# Patient Record
Sex: Male | Born: 1965 | Race: White | Hispanic: No | Marital: Married | Smoking: Never smoker
Health system: Southern US, Community
[De-identification: ages and names within clinical notes are randomized; demographics above are authoritative.]

## PROBLEM LIST (undated history)

## (undated) DIAGNOSIS — I10 Essential (primary) hypertension: Secondary | ICD-10-CM

## (undated) DIAGNOSIS — E559 Vitamin D deficiency, unspecified: Secondary | ICD-10-CM

## (undated) DIAGNOSIS — Z8489 Family history of other specified conditions: Secondary | ICD-10-CM

## (undated) HISTORY — PX: KNEE SURGERY: SHX244

---

## 2006-01-19 ENCOUNTER — Ambulatory Visit: Payer: Self-pay | Admitting: Unknown Physician Specialty

## 2006-01-30 ENCOUNTER — Ambulatory Visit: Payer: Self-pay | Admitting: Unknown Physician Specialty

## 2017-05-17 ENCOUNTER — Emergency Department: Payer: BC Managed Care – PPO

## 2017-05-17 ENCOUNTER — Encounter: Payer: Self-pay | Admitting: Emergency Medicine

## 2017-05-17 ENCOUNTER — Emergency Department
Admission: EM | Admit: 2017-05-17 | Discharge: 2017-05-17 | Disposition: A | Discharge status: Home / Self Care | Payer: BC Managed Care – PPO | Attending: Emergency Medicine | Admitting: Emergency Medicine

## 2017-05-17 DIAGNOSIS — R0789 Other chest pain: Secondary | ICD-10-CM | POA: Insufficient documentation

## 2017-05-17 DIAGNOSIS — R079 Chest pain, unspecified: Secondary | ICD-10-CM

## 2017-05-17 LAB — CBC
HCT: 45.9 % (ref 40.0–52.0)
Hemoglobin: 15.7 g/dL (ref 13.0–18.0)
MCH: 30.6 pg (ref 26.0–34.0)
MCHC: 34.3 g/dL (ref 32.0–36.0)
MCV: 89.5 fL (ref 80.0–100.0)
PLATELETS: 257 10*3/uL (ref 150–440)
RBC: 5.13 MIL/uL (ref 4.40–5.90)
RDW: 12.9 % (ref 11.5–14.5)
WBC: 7.9 10*3/uL (ref 3.8–10.6)

## 2017-05-17 LAB — FIBRIN DERIVATIVES D-DIMER (ARMC ONLY): Fibrin derivatives D-dimer (ARMC): 352.51 ng{FEU}/mL (ref 0.00–499.00)

## 2017-05-17 LAB — TROPONIN I: Troponin I: <0.03 ng/mL

## 2017-05-17 LAB — BASIC METABOLIC PANEL WITH GFR
Anion gap: 10 (ref 5–15)
BUN: 10 mg/dL (ref 6–20)
CO2: 24 mmol/L (ref 22–32)
Calcium: 9 mg/dL (ref 8.9–10.3)
Chloride: 102 mmol/L (ref 101–111)
Creatinine, Ser: 0.96 mg/dL (ref 0.61–1.24)
GFR calc Af Amer: >60 mL/min
GFR calc non Af Amer: >60 mL/min
Glucose, Bld: 99 mg/dL (ref 65–99)
Potassium: 3.7 mmol/L (ref 3.5–5.1)
Sodium: 136 mmol/L (ref 135–145)

## 2017-05-17 MED ORDER — ASPIRIN 81 MG PO CHEW
324.0000 mg | CHEWABLE_TABLET | Freq: Once | ORAL | Status: AC
  Administered 2017-05-17: 324 mg via ORAL
  Filled 2017-05-17: qty 4

## 2017-05-17 MED ORDER — ASPIRIN 81 MG PO TABS: 81.0000 mg | ORAL_TABLET | Freq: Every day | ORAL | 0 refills | Status: DC

## 2017-05-17 MED ORDER — CYCLOBENZAPRINE HCL 5 MG PO TABS: 5.0000 mg | ORAL_TABLET | Freq: Three times a day (TID) | ORAL | 0 refills | Status: DC | PRN

## 2017-05-17 MED ORDER — DICLOFENAC SODIUM 1 % TD GEL: 2.0000 g | Freq: Four times a day (QID) | TRANSDERMAL | 0 refills | Status: DC

## 2017-05-17 NOTE — Discharge Instructions (Signed)

## 2017-05-17 NOTE — ED Triage Notes (Signed)
FIRST NURSE NOTE-from [facility] for chest pain. bp elevated.  No distress. No medical hx.

## 2017-05-17 NOTE — ED Notes (Signed)
ED Provider at bedside. 

## 2017-05-17 NOTE — ED Triage Notes (Signed)
Pt states that he has been having chest pain for the past week. Pt denies radiation of pain or any associated symptoms. Pt in NAD at this time.

## 2017-05-17 NOTE — ED Provider Notes (Signed)
[facility] Emergency Department Provider Note  ____________________________________________  Time seen: Approximately 6:09 PM  I have reviewed the triage vital signs and the nursing notes.   HISTORY  Chief Complaint Chest Pain   HPI Paul Benton is a 51 y.o. male with no significant past medical history who presents for evaluation of chest pain. Patient reports 1 week of left-sided chest pain around the nipple area that he describes as burning/dull pain that is constantand nonradiating. The pain is slightly worse with inspiration. Patient reports that the pain is worse when he is sitting up but almost fully resolves when he walks around or lays flat. No diaphoresis, nausea, vomiting, palpitations, shortness of breath. Patient reports that he was working in his yard a few days ago and the pain did not get any worse. Denies any personal or family history of blood clots, recent travel or immobilization, leg pain or swelling, hemoptysis, or exogenous hormones. Has a history of heart attack in his grandfather. Pain is 5/10. No cough, congestion, fever, chills, abdominal pain. Patient denies trauma.  History reviewed. No pertinent past medical history.  There are no active problems to display for this patient.   Past Surgical History:  Procedure Laterality Date  . KNEE SURGERY      Prior to Admission medications   Medication Sig Start Date End Date Taking? Authorizing Provider  aspirin 81 MG tablet Take 1 tablet (81 mg total) by mouth daily. 05/17/17   Nita Sickle, MD  cyclobenzaprine (FLEXERIL) 5 MG tablet Take 1 tablet (5 mg total) by mouth 3 (three) times daily as needed for muscle spasms. 05/17/17   Nita Sickle, MD  diclofenac sodium (VOLTAREN) 1 % GEL Apply 2 g topically 4 (four) times daily. 05/17/17   Nita Sickle, MD    Allergies Patient has no known allergies.  No family history on file.  Social History Social History    Substance Use Topics  . Smoking status: Never Smoker  . Smokeless tobacco: Never Used  . Alcohol use Yes     Comment: social    Review of Systems  Constitutional: Negative for fever. Eyes: Negative for visual changes. ENT: Negative for sore throat. Neck: No neck pain  Cardiovascular: + L sided chest pain. Respiratory: Negative for shortness of breath. Gastrointestinal: Negative for abdominal pain, vomiting or diarrhea. Genitourinary: Negative for dysuria. Musculoskeletal: Negative for back pain. Skin: Negative for rash. Neurological: Negative for headaches, weakness or numbness. Psych: No SI or HI  ____________________________________________   PHYSICAL EXAM:  VITAL SIGNS: ED Triage Vitals  Enc Vitals Group     BP 05/17/17 1353 (!) 159/91     Pulse Rate 05/17/17 1353 69     Resp 05/17/17 1353 16     Temp 05/17/17 1351 98.1 F (36.7 C)     Temp Source 05/17/17 1351 Oral     SpO2 05/17/17 1353 99 %     Weight 05/17/17 1350 225 lb (102.1 kg)     Height 05/17/17 1350 5\' 11"  (1.803 m)     Head Circumference --      Peak Flow --      Pain Score 05/17/17 1350 3     Pain Loc --      Pain Edu? --      Excl. in GC? --     Constitutional: Alert and oriented. Well appearing and in no apparent distress. HEENT:      Head: Normocephalic and atraumatic.  Eyes: Conjunctivae are normal. Sclera is non-icteric.       Mouth/Throat: Mucous membranes are moist.       Neck: Supple with no signs of meningismus. Cardiovascular: Regular rate and rhythm. No murmurs, gallops, or rubs. 2+ symmetrical distal pulses are present in all extremities. No JVD. Palpation of the chest with no masses, pain not reproducible on exam Respiratory: Normal respiratory effort. Lungs are clear to auscultation bilaterally. No wheezes, crackles, or rhonchi.  Gastrointestinal: Soft, non tender, and non distended with positive bowel sounds. No rebound or guarding. Musculoskeletal: Nontender with normal  range of motion in all extremities. No edema, cyanosis, or erythema of extremities. Neurologic: Normal speech and language. Face is symmetric. Moving all extremities. No gross focal neurologic deficits are appreciated. Skin: Skin is warm, dry and intact. No rash noted. Psychiatric: Mood and affect are normal. Speech and behavior are normal.  ____________________________________________   LABS (all labs ordered are listed, but only abnormal results are displayed)  Labs Reviewed  BASIC METABOLIC PANEL  CBC  TROPONIN I  TROPONIN I  FIBRIN DERIVATIVES D-DIMER ([facility] ONLY)   ____________________________________________  EKG  ED ECG REPORT I, Nita Sicklearolina Janann Boeve, the attending physician, personally viewed and interpreted this ECG.  Normal sinus rhythm, rate of 73, normal intervals, normal axis, no ST elevations or depressions. No prior for comparison. ____________________________________________  RADIOLOGY  CXR: Negative  ____________________________________________   PROCEDURES  Procedure(s) performed: None Procedures Critical Care performed:  None ____________________________________________   INITIAL IMPRESSION / ASSESSMENT AND PLAN / ED COURSE   51 y.o. male with no significant past medical history who presents for evaluation of atypical L sided chest pain for 1 week. Pain wirse when sitting but almost fully resolves with standing and laying flat. No associated symptoms. EKG with no ischemia. Heart score 1. Troponin x 2 negative. Wells low risk, PERC positive with negative d-dimer. CXR with no evidence of PTX or PNA. No breast masses palpable. Unclear etiology, possibly MSk. Will dc on ASA, voltaren gel, muscle relaxant and close f/u with PCP and cardiology. Discussed return precautions with patient.      As part of my medical decision making, I reviewed the following data within the electronic MEDICAL RECORD NUMBER Nursing notes reviewed and incorporated, Labs reviewed , EKG  interpreted , Old chart reviewed, Radiograph reviewed , Notes from prior ED visits and [place] Controlled Substance Database    Pertinent labs & imaging results that were available during my care of the patient were reviewed by me and considered in my medical decision making (see chart for details).    ____________________________________________   FINAL CLINICAL IMPRESSION(S) / ED DIAGNOSES  Final diagnoses:  Chest pain, unspecified type      NEW MEDICATIONS STARTED DURING THIS VISIT:  New Prescriptions   ASPIRIN 81 MG TABLET    Take 1 tablet (81 mg total) by mouth daily.   CYCLOBENZAPRINE (FLEXERIL) 5 MG TABLET    Take 1 tablet (5 mg total) by mouth 3 (three) times daily as needed for muscle spasms.   DICLOFENAC SODIUM (VOLTAREN) 1 % GEL    Apply 2 g topically 4 (four) times daily.     Note:  This document was prepared using Dragon voice recognition software and may include unintentional dictation errors.    Nita SickleVeronese, [place], MD 05/17/17 [phone]

## 2018-06-25 IMAGING — CR DG CHEST 2V
1 series · 2 of 2 positions shown · non-contrast
Comparison: None.

CLINICAL DATA: Chest pain.

EXAM:
CHEST  2 VIEW

[Series 1: dg chest 2 view · 0.14mm/px · 2 of 2 slices shown]
[im 1/2]
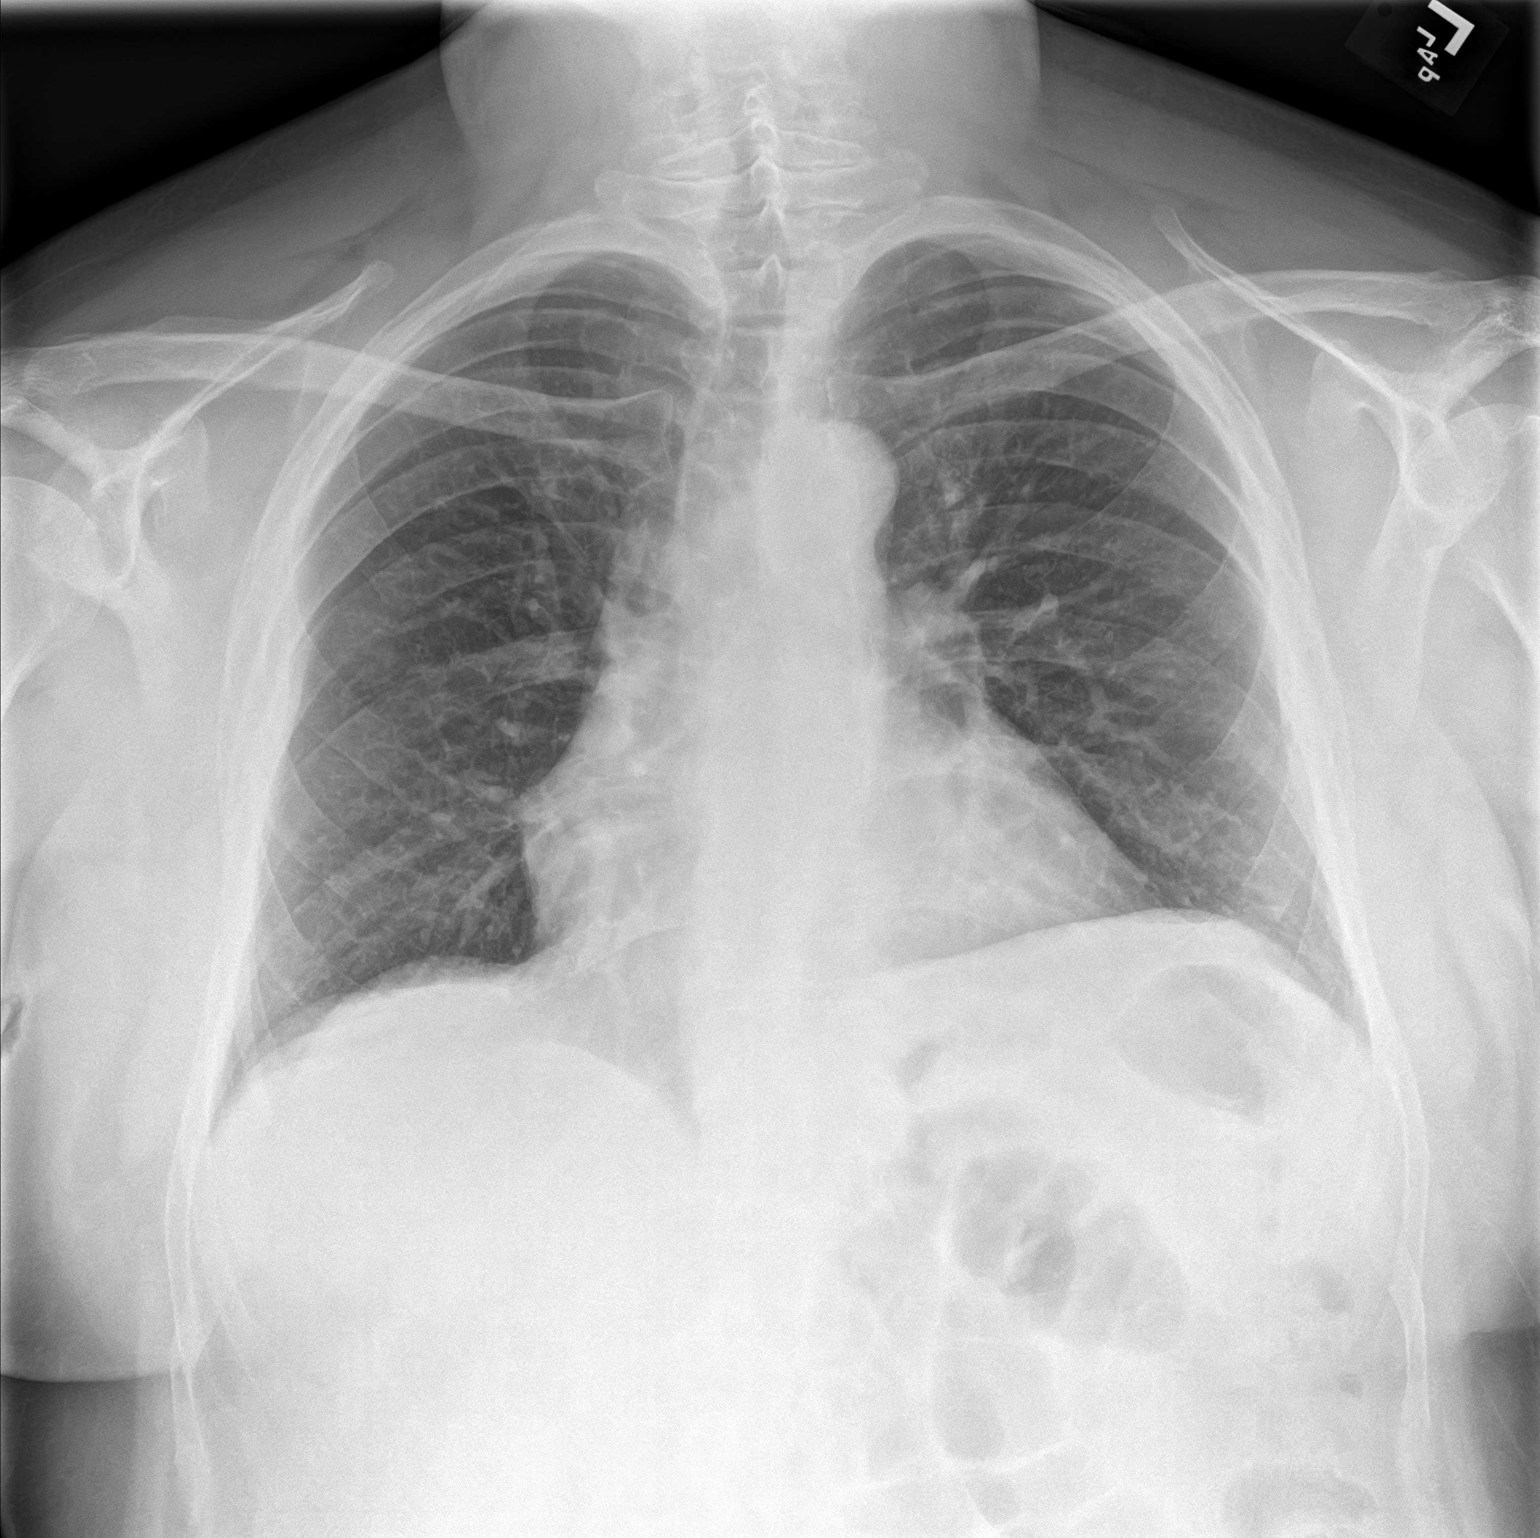
[im 2/2]
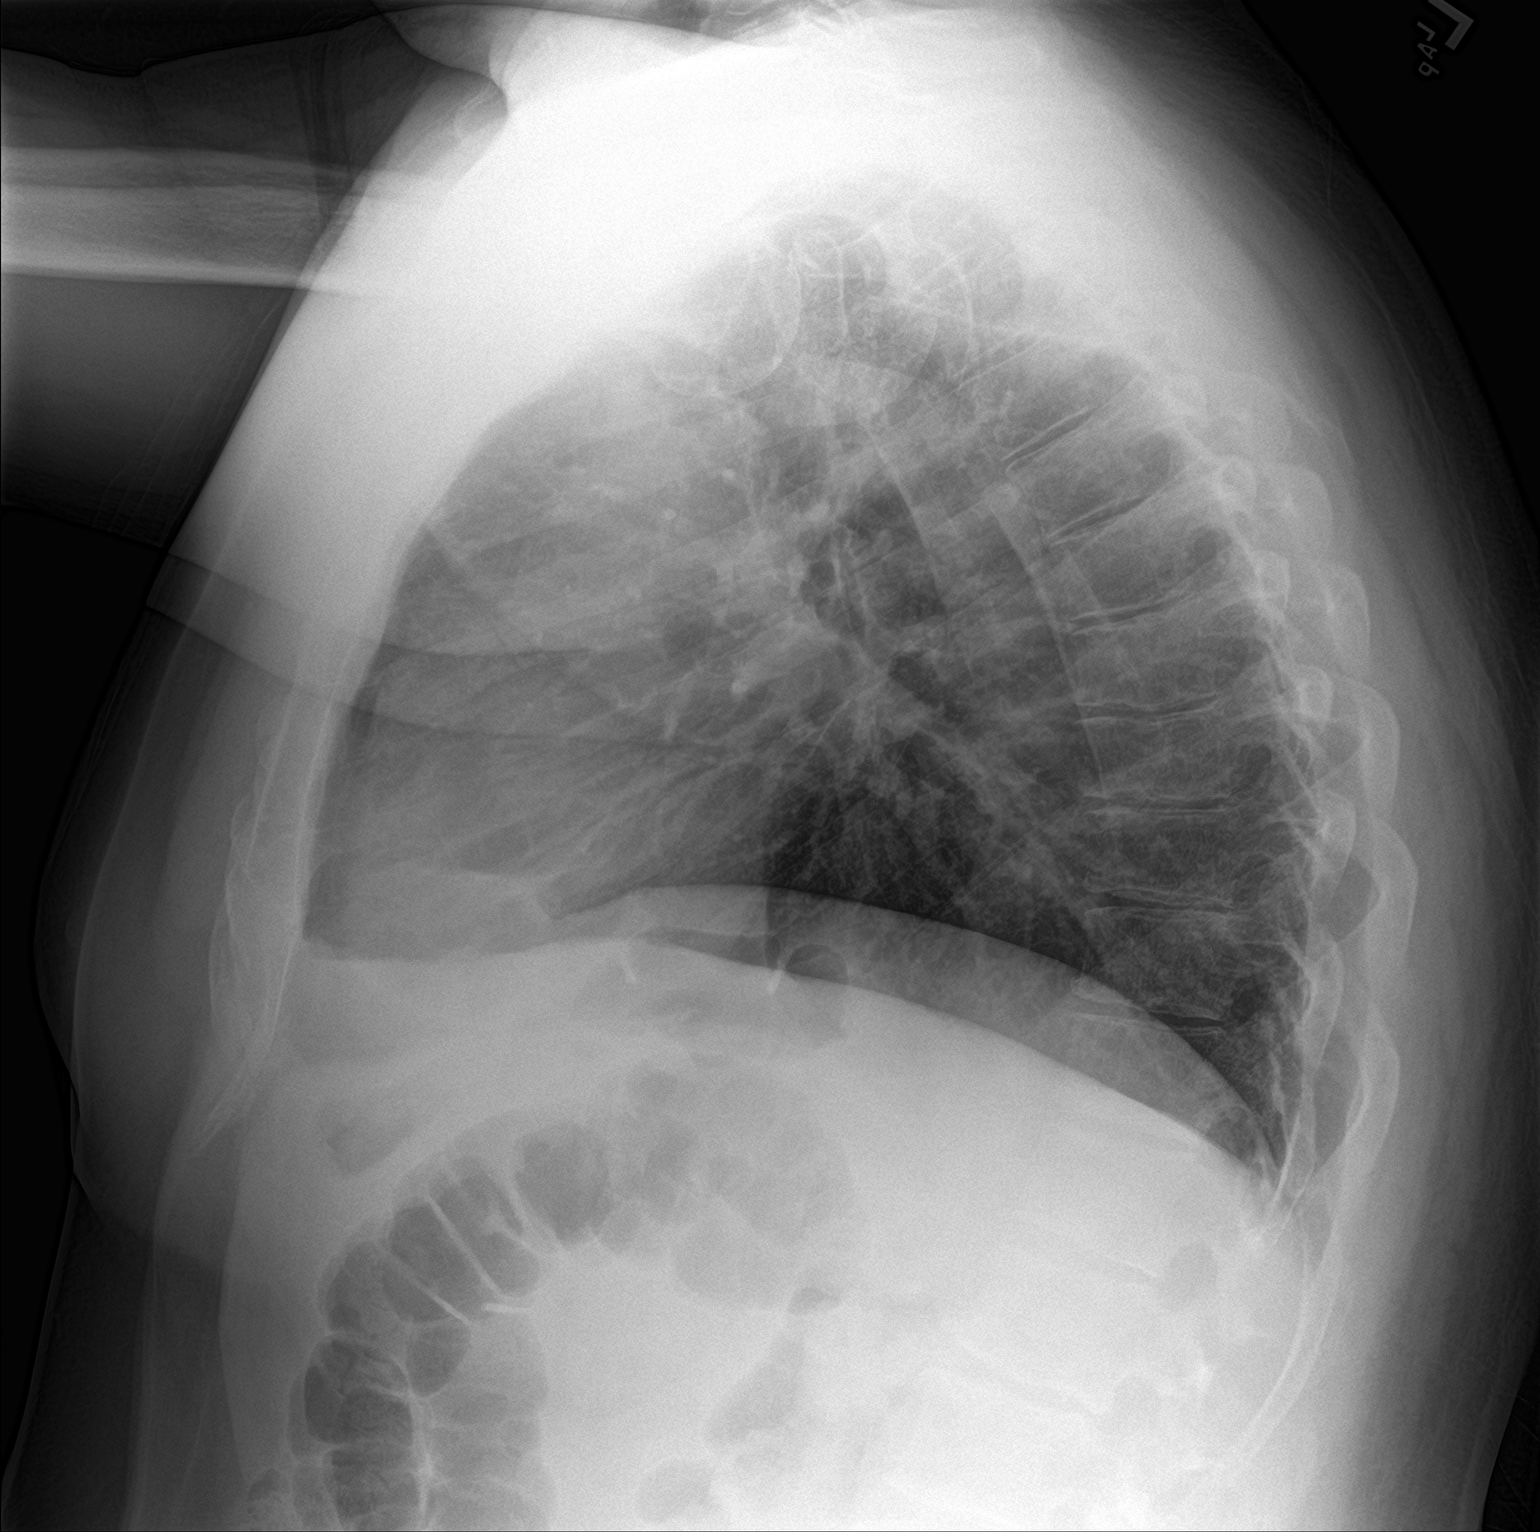

[2 of 2 positions shown; findings below may reference images not displayed]

FINDINGS: The heart size and mediastinal contours are within normal limits.
Both lungs are clear. No pneumothorax or pleural effusion is noted.
The visualized skeletal structures are unremarkable.
IMPRESSION: No active cardiopulmonary disease.

## 2019-12-24 ENCOUNTER — Ambulatory Visit: Payer: Self-pay | Attending: Internal Medicine

## 2024-07-31 ENCOUNTER — Other Ambulatory Visit: Payer: Self-pay | Admitting: Orthopedic Surgery

## 2024-08-07 ENCOUNTER — Encounter
Admission: RE | Admit: 2024-08-07 | Discharge: 2024-08-07 | Disposition: A | Payer: Self-pay | Source: Ambulatory Visit | Attending: Orthopedic Surgery

## 2024-08-07 ENCOUNTER — Other Ambulatory Visit: Payer: Self-pay

## 2024-08-07 VITALS — BP 149/95 | HR 70 | Temp 98.1°F | Resp 18 | Ht 71.5 in | Wt 237.0 lb

## 2024-08-07 DIAGNOSIS — Z01818 Encounter for other preprocedural examination: Secondary | ICD-10-CM | POA: Insufficient documentation

## 2024-08-07 DIAGNOSIS — Z01812 Encounter for preprocedural laboratory examination: Secondary | ICD-10-CM | POA: Diagnosis present

## 2024-08-07 DIAGNOSIS — Z01810 Encounter for preprocedural cardiovascular examination: Secondary | ICD-10-CM | POA: Diagnosis present

## 2024-08-07 HISTORY — DX: Essential (primary) hypertension: I10

## 2024-08-07 HISTORY — DX: Family history of other specified conditions: Z84.89

## 2024-08-07 HISTORY — DX: Vitamin D deficiency, unspecified: E55.9

## 2024-08-07 LAB — CBC WITH DIFFERENTIAL/PLATELET
Abs Immature Granulocytes: 0.08 K/uL — ABNORMAL HIGH (ref 0.00–0.07)
Basophils Absolute: 0.1 K/uL (ref 0.0–0.1)
Basophils Relative: 1 %
Eosinophils Absolute: 0.1 K/uL (ref 0.0–0.5)
Eosinophils Relative: 1 %
HCT: 47 % (ref 39.0–52.0)
Hemoglobin: 15.3 g/dL (ref 13.0–17.0)
Immature Granulocytes: 1 %
Lymphocytes Relative: 21 %
Lymphs Abs: 1.7 K/uL (ref 0.7–4.0)
MCH: 30.4 pg (ref 26.0–34.0)
MCHC: 32.6 g/dL (ref 30.0–36.0)
MCV: 93.4 fL (ref 80.0–100.0)
Monocytes Absolute: 0.6 K/uL (ref 0.1–1.0)
Monocytes Relative: 8 %
Neutro Abs: 5.7 K/uL (ref 1.7–7.7)
Neutrophils Relative %: 68 %
Platelets: 314 K/uL (ref 150–400)
RBC: 5.03 MIL/uL (ref 4.22–5.81)
RDW: 13 % (ref 11.5–15.5)
WBC: 8.2 K/uL (ref 4.0–10.5)
nRBC: 0 % (ref 0.0–0.2)

## 2024-08-07 LAB — COMPREHENSIVE METABOLIC PANEL WITH GFR
ALT: 34 U/L (ref 0–44)
AST: 28 U/L (ref 15–41)
Albumin: 4.7 g/dL (ref 3.5–5.0)
Alkaline Phosphatase: 82 U/L (ref 38–126)
Anion gap: 12 (ref 5–15)
BUN: 14 mg/dL (ref 6–20)
CO2: 26 mmol/L (ref 22–32)
Calcium: 9.5 mg/dL (ref 8.9–10.3)
Chloride: 103 mmol/L (ref 98–111)
Creatinine, Ser: 0.85 mg/dL (ref 0.61–1.24)
GFR, Estimated: >60 mL/min
Glucose, Bld: 97 mg/dL (ref 70–99)
Potassium: 3.8 mmol/L (ref 3.5–5.1)
Sodium: 141 mmol/L (ref 135–145)
Total Bilirubin: 0.5 mg/dL (ref 0.0–1.2)
Total Protein: 7.6 g/dL (ref 6.5–8.1)

## 2024-08-07 LAB — URINALYSIS, ROUTINE W REFLEX MICROSCOPIC
Bilirubin Urine: NEGATIVE
Glucose, UA: NEGATIVE mg/dL
Hgb urine dipstick: NEGATIVE
Ketones, ur: NEGATIVE mg/dL
Leukocytes,Ua: NEGATIVE
Nitrite: NEGATIVE
Protein, ur: NEGATIVE mg/dL
Specific Gravity, Urine: 1.008 (ref 1.005–1.030)
pH: 7 (ref 5.0–8.0)

## 2024-08-07 LAB — SURGICAL PCR SCREEN
MRSA, PCR: NEGATIVE
Staphylococcus aureus: NEGATIVE

## 2024-08-07 LAB — TYPE AND SCREEN
ABO/RH(D): B POS
Antibody Screen: NEGATIVE

## 2024-08-07 NOTE — Patient Instructions (Addendum)
 Your procedure is scheduled on:  THURSDAY JANUARY 29  Report to the Registration Desk on the 1st floor of the [facility] To find out your arrival time, please call [phone] between 1PM - 3PM on: [facility]  JANUARY 28   If your arrival time is 6:00 am, do not arrive before that time as the [facility] entrance doors do not open until 6:00 am.  REMEMBER: Instructions that are not followed completely may result in serious medical risk, up to and including death; or upon the discretion of your surgeon and anesthesiologist your surgery may need to be rescheduled.  Do not eat food after midnight the night before surgery.  No gum chewing or hard candies.  You may however, drink CLEAR liquids up to 3 hours before you are scheduled to arrive for your surgery. Do not drink anything within 3 hours of your scheduled arrival time.  Clear liquids include: - water  - apple juice without pulp - gatorade (not RED colors) - black coffee or tea (Do NOT add milk or creamers to the coffee or tea) Do NOT drink anything that is not on this list.  In addition, your doctor has ordered for you to drink the provided:  Ensure Pre-Surgery Clear Carbohydrate Drink   Drinking this carbohydrate drink up to three hours before surgery helps to reduce insulin resistance and improve patient outcomes. Please complete drinking 3 hours before scheduled arrival time.  One week prior to surgery: Stop Anti-inflammatories (NSAIDS) such as Advil, Aleve, Ibuprofen, Motrin, Naproxen, Naprosyn and Aspirin  based products such as Excedrin, Goody's Powder, BC Powder. Stop ANY OVER THE COUNTER supplements until after surgery. Cholecalciferol (VITAMIN D)  You may however, continue to take Tylenol  if needed for pain up until the day of surgery.  meloxicam [facility])  hold for 7 days prior to surgery, January 23  Continue taking all of your other prescription medications up until the day of surgery.  ON THE DAY OF SURGERY DO  NOT TAKE ANY MEDICATION  No Alcohol for 24 hours before or after surgery.  Do not use any recreational drugs for at least a week (preferably 2 weeks) before your surgery.  Please be advised that the combination of cocaine and anesthesia may have negative outcomes, up to and including death. If you test positive for cocaine, your surgery will be cancelled.  On the morning of surgery brush your teeth with toothpaste and water, you may rinse your mouth with mouthwash if you wish. Do not swallow any toothpaste or mouthwash.  Use CHG Soap as directed on instruction sheet.  Do not wear jewelry, make-up, hairpins, clips or nail polish.  For welded (permanent) jewelry: bracelets, anklets, waist bands, etc.  Please have this removed prior to surgery.  If it is not removed, there is a chance that hospital personnel will need to cut it off on the day of surgery.  Do not wear lotions, powders, or perfumes.   Do not shave body hair from the neck down 48 hours before surgery.  Do not bring valuables to the hospital. [facility] is not responsible for any missing/lost belongings or valuables.   Notify your doctor if there is any change in your medical condition (cold, fever, infection).  Wear comfortable clothing (specific to your surgery type) to the hospital.  After surgery, you can help prevent lung complications by doing breathing exercises.  Take deep breaths and cough every 1-2 hours. Your doctor may order a device called an Incentive Spirometer to help  you take deep breaths.  If you are being admitted to the hospital overnight, leave your suitcase in the car. After surgery it may be brought to your room.  In case of increased patient census, it may be necessary for you, the patient, to continue your postoperative care in the Same Day Surgery department.  If you are being discharged the day of surgery, you will not be allowed to drive home. You will need a responsible individual to drive  you home and stay with you for 24 hours after surgery.   If you are taking public transportation, you will need to have a responsible individual with you.  Please call the Pre-admissions Testing Dept. at [phone] if you have any questions about these instructions.  Surgery Visitation Policy:  Patients having surgery or a procedure may have two visitors.  Children under the age of 24 must have an adult with them who is not the patient.  Inpatient Visitation:    Visiting hours are 7 a.m. to 8 p.m. Up to four visitors are allowed at one time in a patient room. The visitors may rotate out with other people during the day.  One visitor age 45 or older may stay with the patient overnight and must be in the room by 8 p.m.   Merchandiser, Retail to address health-related social needs:  [email or web address]       Pre-operative 4 CHG Bath Instructions   You can play a key role in reducing the risk of infection after surgery. Your skin needs to be as free of germs as possible. You can reduce the number of germs on your skin by washing with CHG (chlorhexidine  gluconate) soap before surgery. CHG is an antiseptic soap that kills germs and continues to kill germs even after washing.   DO NOT use if you have an allergy to chlorhexidine /CHG or antibacterial soaps. If your skin becomes reddened or irritated, stop using the CHG and notify one of our RNs at [phone].   Please shower with the CHG soap starting 4 days before surgery using the following schedule:   STARING SUNDAY JANUARY 25     Please keep in mind the following:  DO NOT shave, including legs and underarms, starting the day of your first shower.   You may shave your face at any point before/day of surgery.  Place clean sheets on your bed the day you start using CHG soap. Use a clean washcloth (not used since being washed) for each shower. DO NOT sleep with pets once you start using the CHG.   CHG  Shower Instructions:  If you choose to wash your hair and private area, wash first with your normal shampoo/soap.  After you use shampoo/soap, rinse your hair and body thoroughly to remove shampoo/soap residue.  Turn the water OFF and apply about 3 tablespoons (45 ml) of CHG soap to a CLEAN washcloth.  Apply CHG soap ONLY FROM YOUR NECK DOWN TO YOUR TOES (washing for 3-5 minutes)  DO NOT use CHG soap on face, private areas, open wounds, or sores.  Pay special attention to the area where your surgery is being performed.  If you are having back surgery, having someone wash your back for you may be helpful. Wait 2 minutes after CHG soap is applied, then you may rinse off the CHG soap.  Pat dry with a clean towel  Put on clean clothes/pajamas   If you choose to wear lotion, please use ONLY the CHG-compatible lotions on the  back of this paper.     Additional instructions for the day of surgery: DO NOT APPLY any lotions, deodorants, cologne, or perfumes.   Put on clean/comfortable clothes.  Brush your teeth.  Ask your nurse before applying any prescription medications to the skin.      CHG Compatible Lotions   Aveeno Moisturizing lotion  Cetaphil Moisturizing Cream  Cetaphil Moisturizing Lotion  Clairol Herbal Essence Moisturizing Lotion, Dry Skin  Clairol Herbal Essence Moisturizing Lotion, Extra Dry Skin  Clairol Herbal Essence Moisturizing Lotion, Normal Skin  Curel Age Defying Therapeutic Moisturizing Lotion with Alpha Hydroxy  Curel Extreme Care Body Lotion  Curel Soothing Hands Moisturizing Hand Lotion  Curel Therapeutic Moisturizing Cream, Fragrance-Free  Curel Therapeutic Moisturizing Lotion, Fragrance-Free  Curel Therapeutic Moisturizing Lotion, Original Formula  Eucerin Daily Replenishing Lotion  Eucerin Dry Skin Therapy Plus Alpha Hydroxy Crme  Eucerin Dry Skin Therapy Plus Alpha Hydroxy Lotion  Eucerin Original Crme  Eucerin Original Lotion  Eucerin Plus Crme  Eucerin Plus Lotion  Eucerin TriLipid Replenishing Lotion  Keri Anti-Bacterial Hand Lotion  Keri Deep Conditioning Original Lotion Dry Skin Formula Softly Scented  Keri Deep Conditioning Original Lotion, Fragrance Free Sensitive Skin Formula  Keri Lotion Fast Absorbing Fragrance Free Sensitive Skin Formula  Keri Lotion Fast Absorbing Softly Scented Dry Skin Formula  Keri Original Lotion  Keri Skin Renewal Lotion Keri Silky Smooth Lotion  Keri Silky Smooth Sensitive Skin Lotion  Nivea Body Creamy Conditioning Oil  Nivea Body Extra Enriched Lotion  Nivea Body Original Lotion  Nivea Body Sheer Moisturizing Lotion Nivea Crme  Nivea Skin Firming Lotion  NutraDerm 30 Skin Lotion  NutraDerm Skin Lotion  NutraDerm Therapeutic Skin Cream  NutraDerm Therapeutic Skin Lotion  ProShield Protective Hand Cream  Provon moisturizing lotion     How to Use an Incentive Spirometer  An incentive spirometer is a tool that measures how well you are filling your lungs with each breath. Learning to take long, deep breaths using this tool can help you keep your lungs clear and active. This may help to reverse or lessen your chance of developing breathing (pulmonary) problems, especially infection. You may be asked to use a spirometer: After a surgery. If you have a lung problem or a history of smoking. After a long period of time when you have been unable to move or be active. If the spirometer includes an indicator to show the highest number that you have reached, your health care provider or respiratory therapist will help you set a goal. Keep a log of your progress as told by your health care provider. What are the risks? Breathing too quickly may cause dizziness or cause you to pass out. Take your time so you do not get dizzy or light-headed. If you are in pain, you may need to take pain medicine before doing incentive spirometry. It is harder to take a deep breath if you are having pain. How to use  your incentive spirometer  Sit up on the edge of your bed or on a chair. Hold the incentive spirometer so that it is in an upright position. Before you use the spirometer, breathe out normally. Place the mouthpiece in your mouth. Make sure your lips are closed tightly around it. Breathe in slowly and as deeply as you can through your mouth, causing the piston or the ball to rise toward the top of the chamber. Hold your breath for 3-5 seconds, or for as long as possible. If the spirometer includes a coach  indicator, use this to guide you in breathing. Slow down your breathing if the indicator goes above the marked areas. Remove the mouthpiece from your mouth and breathe out normally. The piston or ball will return to the bottom of the chamber. Rest for a few seconds, then repeat the steps 10 or more times. Take your time and take a few normal breaths between deep breaths so that you do not get dizzy or light-headed. Do this every 1-2 hours when you are awake. If the spirometer includes a goal marker to show the highest number you have reached (best effort), use this as a goal to work toward during each repetition. After each set of 10 deep breaths, cough a few times. This will help to make sure that your lungs are clear. If you have an incision on your chest or abdomen from surgery, place a pillow or a rolled-up towel firmly against the incision when you cough. This can help to reduce pain while taking deep breaths and coughing. General tips When you are able to get out of bed: Walk around often. Continue to take deep breaths and cough in order to clear your lungs. Keep using the incentive spirometer until your health care provider says it is okay to stop using it. If you have been in the hospital, you may be told to keep using the spirometer at home. Contact a health care provider if: You are having difficulty using the spirometer. You have trouble using the spirometer as often as  instructed. Your pain medicine is not giving enough relief for you to use the spirometer as told. You have a fever. Get help right away if: You develop shortness of breath. You develop a cough with bloody mucus from the lungs. You have fluid or blood coming from an incision site after you cough. Summary An incentive spirometer is a tool that can help you learn to take long, deep breaths to keep your lungs clear and active. You may be asked to use a spirometer after a surgery, if you have a lung problem or a history of smoking, or if you have been inactive for a long period of time. Use your incentive spirometer as instructed every 1-2 hours while you are awake. If you have an incision on your chest or abdomen, place a pillow or a rolled-up towel firmly against your incision when you cough. This will help to reduce pain. Get help right away if you have shortness of breath, you cough up bloody mucus, or blood comes from your incision when you cough. This information is not intended to replace advice given to you by your health care provider. Make sure you discuss any questions you have with your health care provider. Document Revised: 09/21/2019 Document Reviewed: 09/21/2019 Elsevier Patient Education  2023 Elsevier Inc.               Preoperative Educational Videos for Total Hip, Knee and Shoulder Replacements  To better prepare for surgery, please view our videos that explain the physical activity and discharge planning required to have the best surgical recovery at [facility].  [email or web address]  Questions? Call [phone] or email [email or web address]

## 2024-08-13 ENCOUNTER — Other Ambulatory Visit: Payer: Self-pay

## 2024-08-13 ENCOUNTER — Encounter
Admission: RE | Disposition: A | Discharge status: Home / Self Care | Payer: Self-pay | Source: Ambulatory Visit | Attending: Orthopedic Surgery

## 2024-08-13 ENCOUNTER — Ambulatory Visit
Admission: RE | Admit: 2024-08-13 | Discharge: 2024-08-14 | Disposition: A | Discharge status: Home / Self Care | Payer: Self-pay | Source: Ambulatory Visit | Attending: Orthopedic Surgery | Admitting: Orthopedic Surgery

## 2024-08-13 ENCOUNTER — Encounter: Payer: Self-pay | Admitting: Orthopedic Surgery

## 2024-08-13 ENCOUNTER — Ambulatory Visit

## 2024-08-13 DIAGNOSIS — F419 Anxiety disorder, unspecified: Secondary | ICD-10-CM | POA: Insufficient documentation

## 2024-08-13 DIAGNOSIS — Z96642 Presence of left artificial hip joint: Secondary | ICD-10-CM | POA: Diagnosis present

## 2024-08-13 DIAGNOSIS — I10 Essential (primary) hypertension: Secondary | ICD-10-CM | POA: Diagnosis not present

## 2024-08-13 DIAGNOSIS — F32A Depression, unspecified: Secondary | ICD-10-CM | POA: Diagnosis not present

## 2024-08-13 DIAGNOSIS — Z6833 Body mass index (BMI) 33.0-33.9, adult: Secondary | ICD-10-CM | POA: Insufficient documentation

## 2024-08-13 DIAGNOSIS — E669 Obesity, unspecified: Secondary | ICD-10-CM | POA: Diagnosis not present

## 2024-08-13 DIAGNOSIS — M1612 Unilateral primary osteoarthritis, left hip: Secondary | ICD-10-CM | POA: Diagnosis present

## 2024-08-13 LAB — ABO/RH: ABO/RH(D): B POS

## 2024-08-13 MED ORDER — DEXMEDETOMIDINE HCL IN NACL 80 MCG/20ML IV SOLN
INTRAVENOUS | Status: AC
  Filled 2024-08-13: qty 20

## 2024-08-13 MED ORDER — METOCLOPRAMIDE HCL 10 MG PO TABS: 5.0000 mg | ORAL_TABLET | Freq: Three times a day (TID) | ORAL | Status: DC | PRN

## 2024-08-13 MED ORDER — FENTANYL CITRATE (PF) 100 MCG/2ML IJ SOLN: 25.0000 ug | INTRAMUSCULAR | Status: DC | PRN

## 2024-08-13 MED ORDER — PHENYLEPHRINE HCL-NACL 20-0.9 MG/250ML-% IV SOLN
INTRAVENOUS | Status: AC
  Filled 2024-08-13: qty 250

## 2024-08-13 MED ORDER — SODIUM CHLORIDE (PF) 0.9 % IJ SOLN
INTRAMUSCULAR | Status: AC
  Filled 2024-08-13: qty 20

## 2024-08-13 MED ORDER — DEXAMETHASONE SOD PHOSPHATE PF 10 MG/ML IJ SOLN
INTRAMUSCULAR | Status: AC
  Filled 2024-08-13: qty 1

## 2024-08-13 MED ORDER — DEXAMETHASONE SODIUM PHOSPHATE 4 MG/ML IJ SOLN
INTRAMUSCULAR | Status: DC | PRN
  Administered 2024-08-13: 8 mg via INTRAVENOUS

## 2024-08-13 MED ORDER — AMLODIPINE BESYLATE 10 MG PO TABS
10.0000 mg | ORAL_TABLET | Freq: Every day | ORAL | Status: DC
  Filled 2024-08-13: qty 1

## 2024-08-13 MED ORDER — ACETAMINOPHEN 10 MG/ML IV SOLN: 1000.0000 mg | Freq: Once | INTRAVENOUS | Status: DC | PRN

## 2024-08-13 MED ORDER — PHENYLEPHRINE HCL-NACL 20-0.9 MG/250ML-% IV SOLN
INTRAVENOUS | Status: DC | PRN
  Administered 2024-08-13: 20 ug/min via INTRAVENOUS

## 2024-08-13 MED ORDER — GLYCOPYRROLATE 0.2 MG/ML IJ SOLN
INTRAMUSCULAR | Status: AC
  Filled 2024-08-13: qty 1

## 2024-08-13 MED ORDER — CHLORHEXIDINE GLUCONATE 0.12 % MT SOLN: 15.0000 mL | Freq: Once | OROMUCOSAL | Status: AC

## 2024-08-13 MED ORDER — SODIUM CHLORIDE (PF) 0.9 % IJ SOLN
INTRAMUSCULAR | Status: DC | PRN
  Administered 2024-08-13: 50 mL via INTRAMUSCULAR

## 2024-08-13 MED ORDER — CEFAZOLIN SODIUM-DEXTROSE 2-4 GM/100ML-% IV SOLN
2.0000 g | INTRAVENOUS | Status: AC
  Administered 2024-08-13: 2 g via INTRAVENOUS

## 2024-08-13 MED ORDER — SURGIPHOR WOUND IRRIGATION SYSTEM - OPTIME: TOPICAL | Status: DC | PRN

## 2024-08-13 MED ORDER — ONDANSETRON HCL 4 MG/2ML IJ SOLN
INTRAMUSCULAR | Status: AC
  Filled 2024-08-13: qty 2

## 2024-08-13 MED ORDER — EPHEDRINE SULFATE (PRESSORS) 25 MG/5ML IV SOSY
PREFILLED_SYRINGE | INTRAVENOUS | Status: DC | PRN
  Administered 2024-08-13: 10 mg via INTRAVENOUS

## 2024-08-13 MED ORDER — ONDANSETRON HCL 4 MG PO TABS: 4.0000 mg | ORAL_TABLET | Freq: Four times a day (QID) | ORAL | Status: DC | PRN

## 2024-08-13 MED ORDER — SEVOFLURANE IN SOLN
RESPIRATORY_TRACT | Status: AC
  Filled 2024-08-13: qty 250

## 2024-08-13 MED ORDER — PROPOFOL 10 MG/ML IV BOLUS
INTRAVENOUS | Status: AC
  Filled 2024-08-13: qty 20

## 2024-08-13 MED ORDER — SODIUM CHLORIDE 0.9 % IR SOLN
Status: DC | PRN
  Administered 2024-08-13: 100 mL

## 2024-08-13 MED ORDER — 0.9 % SODIUM CHLORIDE (POUR BTL) OPTIME
TOPICAL | Status: DC | PRN
  Administered 2024-08-13: 500 mL

## 2024-08-13 MED ORDER — ACETAMINOPHEN 325 MG PO TABS: 325.0000 mg | ORAL_TABLET | Freq: Four times a day (QID) | ORAL | Status: DC | PRN

## 2024-08-13 MED ORDER — IRBESARTAN 150 MG PO TABS
150.0000 mg | ORAL_TABLET | Freq: Every day | ORAL | Status: DC
  Filled 2024-08-13: qty 1

## 2024-08-13 MED ORDER — CHLORHEXIDINE GLUCONATE 0.12 % MT SOLN
OROMUCOSAL | Status: AC
  Filled 2024-08-13: qty 15

## 2024-08-13 MED ORDER — PROPOFOL 1000 MG/100ML IV EMUL
INTRAVENOUS | Status: AC
  Filled 2024-08-13: qty 100

## 2024-08-13 MED ORDER — HYDROCODONE-ACETAMINOPHEN 5-325 MG PO TABS
1.0000 | ORAL_TABLET | ORAL | Status: DC | PRN
  Administered 2024-08-13 – 2024-08-14 (×5): 1 via ORAL
  Filled 2024-08-13 (×5): qty 1

## 2024-08-13 MED ORDER — CEFAZOLIN SODIUM-DEXTROSE 2-4 GM/100ML-% IV SOLN
INTRAVENOUS | Status: AC
  Filled 2024-08-13: qty 100

## 2024-08-13 MED ORDER — SODIUM CHLORIDE 0.9 % IV SOLN: INTRAVENOUS | Status: DC

## 2024-08-13 MED ORDER — SENNA 8.6 MG PO TABS
1.0000 | ORAL_TABLET | Freq: Every day | ORAL | Status: DC
  Administered 2024-08-13 – 2024-08-14 (×2): 8.6 mg via ORAL
  Filled 2024-08-13 (×2): qty 1

## 2024-08-13 MED ORDER — LACTATED RINGERS IV SOLN: INTRAVENOUS | Status: DC

## 2024-08-13 MED ORDER — PHENYLEPHRINE 80 MCG/ML (10ML) SYRINGE FOR IV PUSH (FOR BLOOD PRESSURE SUPPORT)
PREFILLED_SYRINGE | INTRAVENOUS | Status: DC | PRN
  Administered 2024-08-13: 80 ug via INTRAVENOUS
  Administered 2024-08-13: 160 ug via INTRAVENOUS

## 2024-08-13 MED ORDER — ONDANSETRON HCL 4 MG/2ML IJ SOLN
INTRAMUSCULAR | Status: DC | PRN
  Administered 2024-08-13: 4 mg via INTRAVENOUS

## 2024-08-13 MED ORDER — ORAL CARE MOUTH RINSE
15.0000 mL | Freq: Once | OROMUCOSAL | Status: AC
  Administered 2024-08-13: 15 mL via OROMUCOSAL

## 2024-08-13 MED ORDER — MIDAZOLAM HCL 5 MG/5ML IJ SOLN
INTRAMUSCULAR | Status: DC | PRN
  Administered 2024-08-13: 2 mg via INTRAVENOUS

## 2024-08-13 MED ORDER — MORPHINE SULFATE (PF) 2 MG/ML IV SOLN: 0.5000 mg | INTRAVENOUS | Status: DC | PRN

## 2024-08-13 MED ORDER — ONDANSETRON HCL 4 MG/2ML IJ SOLN: 4.0000 mg | Freq: Once | INTRAMUSCULAR | Status: DC | PRN

## 2024-08-13 MED ORDER — ENOXAPARIN SODIUM 40 MG/0.4ML IJ SOSY
40.0000 mg | PREFILLED_SYRINGE | INTRAMUSCULAR | Status: DC
  Administered 2024-08-14: 40 mg via SUBCUTANEOUS
  Filled 2024-08-13: qty 0.4

## 2024-08-13 MED ORDER — ONDANSETRON HCL 4 MG/2ML IJ SOLN
4.0000 mg | Freq: Four times a day (QID) | INTRAMUSCULAR | Status: DC | PRN
  Filled 2024-08-13: qty 2

## 2024-08-13 MED ORDER — PHENOL 1.4 % MT LIQD: 1.0000 | OROMUCOSAL | Status: DC | PRN

## 2024-08-13 MED ORDER — MENTHOL 3 MG MT LOZG: 1.0000 | LOZENGE | OROMUCOSAL | Status: DC | PRN

## 2024-08-13 MED ORDER — TRAMADOL HCL 50 MG PO TABS: 50.0000 mg | ORAL_TABLET | Freq: Four times a day (QID) | ORAL | Status: DC | PRN

## 2024-08-13 MED ORDER — BUPIVACAINE HCL (PF) 0.5 % IJ SOLN
INTRAMUSCULAR | Status: DC | PRN
  Administered 2024-08-13: 15 mL

## 2024-08-13 MED ORDER — PROPOFOL 10 MG/ML IV BOLUS
INTRAVENOUS | Status: DC | PRN
  Administered 2024-08-13: 100 ug/kg/min via INTRAVENOUS
  Administered 2024-08-13: 120 ug/kg/min via INTRAVENOUS
  Administered 2024-08-13 (×2): 20 mg via INTRAVENOUS

## 2024-08-13 MED ORDER — ACETAMINOPHEN 500 MG PO TABS
1000.0000 mg | ORAL_TABLET | Freq: Three times a day (TID) | ORAL | Status: DC
  Filled 2024-08-13 (×2): qty 2

## 2024-08-13 MED ORDER — PANTOPRAZOLE SODIUM 40 MG PO TBEC
40.0000 mg | DELAYED_RELEASE_TABLET | Freq: Every day | ORAL | Status: DC
  Administered 2024-08-13 – 2024-08-14 (×2): 40 mg via ORAL
  Filled 2024-08-13 (×2): qty 1

## 2024-08-13 MED ORDER — METOCLOPRAMIDE HCL 5 MG/ML IJ SOLN: 5.0000 mg | Freq: Three times a day (TID) | INTRAMUSCULAR | Status: DC | PRN

## 2024-08-13 MED ORDER — TRANEXAMIC ACID-NACL 1000-0.7 MG/100ML-% IV SOLN
INTRAVENOUS | Status: AC
  Filled 2024-08-13: qty 100

## 2024-08-13 MED ORDER — OXYCODONE HCL 5 MG/5ML PO SOLN: 5.0000 mg | Freq: Once | ORAL | Status: DC | PRN

## 2024-08-13 MED ORDER — MIDAZOLAM HCL 2 MG/2ML IJ SOLN
INTRAMUSCULAR | Status: AC
  Filled 2024-08-13: qty 2

## 2024-08-13 MED ORDER — BUPIVACAINE-EPINEPHRINE (PF) 0.25% -1:200000 IJ SOLN
INTRAMUSCULAR | Status: AC
  Filled 2024-08-13: qty 90

## 2024-08-13 MED ORDER — TRANEXAMIC ACID-NACL 1000-0.7 MG/100ML-% IV SOLN
1000.0000 mg | INTRAVENOUS | Status: AC
  Administered 2024-08-13 (×2): 1000 mg via INTRAVENOUS

## 2024-08-13 MED ORDER — KETOROLAC TROMETHAMINE 15 MG/ML IJ SOLN
15.0000 mg | Freq: Four times a day (QID) | INTRAMUSCULAR | Status: AC
  Administered 2024-08-13 – 2024-08-14 (×4): 15 mg via INTRAVENOUS
  Filled 2024-08-13 (×4): qty 1

## 2024-08-13 MED ORDER — AMLODIPINE-OLMESARTAN 10-20 MG PO TABS: 1.0000 | ORAL_TABLET | Freq: Every day | ORAL | Status: DC

## 2024-08-13 MED ORDER — BUPIVACAINE LIPOSOME 1.3 % IJ SUSP
INTRAMUSCULAR | Status: AC
  Filled 2024-08-13: qty 60

## 2024-08-13 MED ORDER — OXYCODONE HCL 5 MG PO TABS: 5.0000 mg | ORAL_TABLET | Freq: Once | ORAL | Status: DC | PRN

## 2024-08-13 MED ORDER — DEXAMETHASONE SOD PHOSPHATE PF 10 MG/ML IJ SOLN: 8.0000 mg | Freq: Once | INTRAMUSCULAR | Status: DC

## 2024-08-13 MED ORDER — BUPIVACAINE HCL (PF) 0.5 % IJ SOLN
INTRAMUSCULAR | Status: AC
  Filled 2024-08-13: qty 10

## 2024-08-13 MED ORDER — CEFAZOLIN SODIUM-DEXTROSE 2-4 GM/100ML-% IV SOLN
2.0000 g | Freq: Four times a day (QID) | INTRAVENOUS | Status: AC
  Administered 2024-08-13 (×2): 2 g via INTRAVENOUS
  Filled 2024-08-13 (×2): qty 100

## 2024-08-13 NOTE — Progress Notes (Signed)
 The beneficiary has a mobility limitation that significantly impairs his/her ability to participate in one or more mobility-related activities of daily living (MRADL) in the home. The patient is able to safely use the walker. The functional mobility deficit can be sufficiently resolved by use of walker.

## 2024-08-13 NOTE — Addendum Note (Signed)
 Addendum  created 08/13/24 1231 by Januel Doolan, CRNA   Clinical Note Signed, Cosign clinical note, Flowsheet accepted, Flowsheet data copied forward, Intraprocedure Blocks edited, Intraprocedure Flowsheets edited, LDA properties accepted, SmartForm saved

## 2024-08-13 NOTE — Transfer of Care (Addendum)
 Immediate Anesthesia Transfer of Care Note  Patient: Paul Benton  Procedure(s) Performed: ARTHROPLASTY, HIP, TOTAL, ANTERIOR APPROACH (Left: Hip)  Patient Location: PACU  Anesthesia Type:Spinal  Level of Consciousness: drowsy  Airway & Oxygen Therapy: Patient Spontanous Breathing and Patient connected to face mask oxygen  Post-op Assessment: Report given to RN and Post -op Vital signs reviewed and stable  Post vital signs: Reviewed and stable  Last Vitals:  Vitals Value Taken Time  BP 109/68 08/13/24 09:45  Temp 36.1 C 08/13/24 09:42  Pulse 65 08/13/24 09:50  Resp 14 08/13/24 09:50  SpO2 94 % 08/13/24 09:50  Vitals shown include unfiled device data.  Last Pain:  Vitals:   08/13/24 0942  TempSrc:   PainSc: Asleep         Complications: No notable events documented.

## 2024-08-13 NOTE — Interval H&P Note (Signed)
Patient history and physical updated. Consent reviewed including risks, benefits, and alternatives to surgery. Patient agrees with above plan to proceed with left anterior total hip arthroplasty  

## 2024-08-13 NOTE — Anesthesia Procedure Notes (Addendum)
 Spinal  Patient location during procedure: OR Start time: 08/13/2024 7:40 AM End time: 08/13/2024 7:44 AM Reason for block: surgical anesthesia  Staffing Performed: other anesthesia staff  Authorized by: Mazzoni, Andrea, MD   Performed by: Shellie Odor, MD  Preanesthetic Checklist Completed: patient identified, IV checked, site marked, risks and benefits discussed, surgical consent, monitors and equipment checked, pre-op evaluation and timeout performed Spinal Block Patient position: sitting Prep: DuraPrep Patient monitoring: heart rate, cardiac monitor, continuous pulse ox and blood pressure Approach: midline Location: L3-4 Injection technique: single-shot Needle Needle type: Pencan  Needle gauge: 24 G Needle length: 10 cm Assessment Sensory level: T6 Events: CSF return  Additional Notes Pt tolerated procedure well, no blood return, CSF noted with flow, date/expiration checked on kit. Sterile technique maintained throughout procedure.

## 2024-08-13 NOTE — TOC Initial Note (Signed)
 Transition of Care [facility]) - Initial/Assessment Note    Patient Details  Name: Paul Benton MRN: [id number] Date of Birth: April 01, 1966  [facility] CM/SW Contact:    Umaima Scholten L Welborn Keena, LCSW Phone Number: 08/13/2024, 4:17 PM  Clinical Narrative:                  DME ordered through [employer]. Home Health prearranged by surgeons office with [facility].        Patient Goals and CMS Choice            Expected Discharge Plan and Services                                              Prior Living Arrangements/Services                       Activities of Daily Living   ADL Screening (condition at time of admission) Independently performs ADLs?: Yes (appropriate for developmental age) Is the patient deaf or have difficulty hearing?: No Does the patient have difficulty seeing, even when wearing glasses/contacts?: No Does the patient have difficulty concentrating, remembering, or making decisions?: No  Permission Sought/Granted                  Emotional Assessment              Admission diagnosis:  Primary osteoarthritis of left hip [M16.12] S/P total left hip arthroplasty [S03.357] Patient Active Problem List   Diagnosis Date Noted   S/P total left hip arthroplasty 08/13/2024   PCP:  Don Lauraine Collar, NP Pharmacy:   [facility] [address] [place] [address] Phone: [phone] Fax: [phone]     Social Drivers of Health (SDOH) Social History: SDOH Screenings   Food Insecurity: No Food Insecurity (08/13/2024)  Housing: Low Risk (08/13/2024)  Transportation Needs: No Transportation Needs (08/13/2024)  Utilities: Not At Risk (08/13/2024)  Financial Resource Strain: Low Risk  (12/20/2023)   Received from [facility]  Tobacco Use: Low Risk (08/07/2024)   SDOH Interventions:     Readmission Risk Interventions     No data to display

## 2024-08-13 NOTE — Op Note (Signed)
 Patient Name: Paul Benton  MRN: [id number]  Pre-Operative Diagnosis: Left hip Osteoarthritis  Post-Operative Diagnosis: (same)  Procedure: Left Total Hip Arthroplasty  Components/Implants: Cup: Trident Tritanium clusterhole 54/E w/x2 screws    Liner: Neutral X3 poly 36/E  Stem: Insignia #5 high offset  Head:Biolox ceramic 36 +27mm   Date of Surgery: 08/13/2024  Surgeon: Arthea Sheer MD  Assistant: Toribio Alas RNFA (present and scrubbed throughout the case, critical for assistance with exposure, retraction, instrumentation, and closure)   Anesthesiologist: Mazzoni  Anesthesia: Spinal   EBL: 200cc  IVF:700cc  Complications: None   Brief history: The patient is a 59 year old male with a history of osteoarthritis of the left hip with pain limiting their range of motion and activities of daily living, which has failed multiple attempts at conservative therapy.  The risks and benefits of total hip arthroplasty as definitive surgical treatment were discussed with the patient, who opted to proceed with the operation.  After outpatient medical clearance and optimization was completed the patient was admitted to [facility] for the procedure.  All preoperative films were reviewed and an appropriate surgical plan was made prior to surgery.   Description of procedure: The patient was brought to the operating room where laterality was confirmed by all those present to be the left side.  The patient was administered spinal anesthesia on a stretcher prior to being moved supine on the operating room table. Patient was given an intravenous dose of antibiotics for surgical prophylaxis and TXA.  All bony prominences and extremities were well padded and the patient was securely attached to the table boots, a perineal post was placed and the patient had a safety strap placed.  Surgical site was prepped with alcohol and chlorhexidine . The surgical site over the hip was and draped in  typical sterile fashion with multiple layers of adhesive and nonadhesive drapes.  The incision site was marked out with a sterile marker and care was taken to assess the position of the ASIS and ensure appropriate position for the incision.    A surgical timeout was then called with participation of all staff in the room the patient was then a confirmed again and laterality confirmed.  Incision was made over the anterior lateral aspect of the proximal thigh in line with the TFL.  Appropriate retractors were placed and all bleeding vessels were coagulated within the subcutaneous and fatty layers.  An incision was made in the TFL fascia in the interval was carefully identified.  The lateral ascending branches of the circumflex vessels were identified, cauterized and carefully dissected. The main vessels were then tied with a 0 silk hand tie.  Retractors were placed around the superior lateral and inferior medial aspects of the femoral neck and a capsulotomy was performed exposing the hip joint.  Retraction stitches were placed and the capsulotomy to assist with visualization.  The femoral head and anterior acetabulum were examined at this time and shown to have full thickness cartilage damage with severe arthritic changes, the decision was made to proceed with total hip arthroplasty. Femoral neck cut was then made and the femoral head was extracted after placing the leg in traction.  Bone wax was then applied to the proximal cut surface of the femur and water cooled bipolar electrocautery was used to address any bleeding around the femoral neck cut.  Retractors were then placed around the acetabulum to fully visualize the joint space, and the remaining labral tissue was removed and pulvinar was removed.  The acetabulum was then sequentially reamed up to the appropriate size in order to get good fit and fill for the acetabular component while under fluoroscopic guidance.  Acetabular component was then placed and  malleted into a secure fit while confirming position and abduction angle and anteversion utilizing fluoroscopy.  2 screws were then placed in the acetabular cup to assist in securing the cup in place. The cup was irrigated,  a real neutral liner was placed, impacted, and checked for stability. The femur traction was dropped and sequentially externally rotated while performing a release of the posterior and superomedial tissues off of the proximal femur to allow for mobility, care was taken to preserve the external rotators and piriformis attachments.  The remaining interval between the abductors and the capsule was dissected out and a retractor was placed over the superolateral aspect of the femur over the greater trochanter.  The leg was carefully brought down into extension and adducted to provide visualization of the proximal femur for broaching.  The femur was then sequentially broached up to an appropriate size which provided for good fill and stability to the femoral broach.  A trial neck and head were placed on the femoral broach and the leg was brought up for reduction.  The hip was reduced and manual check of stability was performed.  The hip was found to be stable in flexion internal rotation and extension external rotation.  Leg lengths were confirmed on fluoroscopy.   The hip was then dislocated the trial neck and head were removed.  The leg was then brought down into extension and adduction in the proximal femur was reexposed.  The broach trial was removed and the femur was irrigated with normal saline prior to the real femoral stem being implanted.  After the femoral stem was seated and shown to have good fit and fill the appropriate head was impacted the leg was brought up and reduced.  There was good range of motion with stability in flexion internal rotation and extension external rotation on testing.  Leg lengths were found to be appropriate on fluoroscopic evaluation at this time.  The hip was  then irrigated with betdine based surgiphor solution and then saline solution.  The capsulotomy was repaired with Ethibond sutures.  A pericapsular and peritrochanteric cocktail with Exparel  and bupivacaine  was then injected as well as the subcutaneous tissues. The fascia was closed with a #1 barbed running suture.  The deep tissues were closed with Vicryl sutures the subcutaneous tissues were closed with interrupted Vicryl sutures and a running barbed 4-0 suture.  The skin was then reinforced with Dermabond and a sterile dressing was placed.   The patient was awoken from anesthesia transferred off of the operating room table onto a hospital bed where examination of leg lengths found the leg lengths to be equal with a good distal pulse.  The patient was then transferred to the PACU in stable condition.

## 2024-08-13 NOTE — Evaluation (Signed)
 Physical Therapy Evaluation Patient Details Name: Paul Benton MRN: [id number] DOB: [birth year] Today's Date: 08/13/2024  History of Present Illness  Pt is a 59 yo male s/p L THA. PMH of anxiety, depression, HTN.  Clinical Impression  Pt A&Ox4, denied pain but still experiencing sensation deficits. Able to SAQ, ankle pump, heel slide with light AAROM, PT evaluation initiated. 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 He was able to perform several HEP exercises with tactile cues, AAROM as needed, though LAQ bilaterally at EOB AROM. Sit <> stand attempted with RW and CGA-minA, but unable to safely progress OOB mobility today. Returned to bed with needs in reach.  Overall the patient demonstrated deficits (see PT Problem List) that impede the patient's functional abilities, safety, and mobility and would benefit from skilled PT intervention.            If plan is discharge home, recommend the following: A little help with bathing/dressing/bathroom;A little help with walking and/or transfers;Assistance with cooking/housework;Assist for transportation;Help with stairs or ramp for entrance   Can travel by private vehicle        Equipment Recommendations Rolling walker (2 wheels)  Recommendations for Other Services       Functional Status Assessment Patient has had a recent decline in their functional status and demonstrates the ability to make significant improvements in function in a reasonable and predictable amount of time.     Precautions / Restrictions Precautions Precautions: Fall;Anterior Hip Precaution Booklet Issued: Yes (comment) Recall of Precautions/Restrictions: Intact Restrictions Weight Bearing Restrictions Per Provider Order: Yes LLE Weight Bearing Per Provider Order: Weight bearing as tolerated      Mobility  Bed Mobility Overal bed mobility: Needs Assistance Bed Mobility: Supine to Sit,  Sit to Supine     Supine to sit: Min assist Sit to supine: Min assist        Transfers Overall transfer level: Needs assistance Equipment used: Rolling walker (2 wheels) Transfers: Sit to/from Stand Sit to Stand: Min assist                Ambulation/Gait               General Gait Details: unable currently due to lack of sensation  Stairs            Wheelchair Mobility     Tilt Bed    Modified Rankin (Stroke Patients Only)       Balance Overall balance assessment: Needs assistance   Sitting balance-Leahy Scale: Good     Standing balance support: Bilateral upper extremity supported Standing balance-Leahy Scale: Poor                               Pertinent Vitals/Pain Pain Assessment Pain Assessment: No/denies pain    Home Living Family/patient expects to be discharged to:: Private residence Living Arrangements: Spouse/significant other Available Help at Discharge: Family Type of Home: House Home Access: Level entry       Home Layout: One level Home Equipment: BSC/3in1      Prior Function Prior Level of Function : Independent/Modified Independent;Driving (teaches full t ime)                     Extremity/Trunk Assessment   Upper Extremity Assessment Upper Extremity Assessment: Overall WFL for tasks assessed    Lower Extremity Assessment  Lower Extremity Assessment:  (difficulty moving BLE against gravity but able to do so with tactile cues)       Communication        Cognition Arousal: Alert Behavior During Therapy: [facility] for tasks assessed/performed   PT - Cognitive impairments: No apparent impairments                         Following commands: Intact       Cueing       General Comments      Exercises     Assessment/Plan    PT Assessment Patient needs continued PT services  PT Problem List Decreased strength;Pain;Decreased range of motion;Decreased activity  tolerance;Decreased knowledge of use of DME;Decreased balance;Decreased mobility;Decreased knowledge of precautions       PT Treatment Interventions DME instruction;Balance training;Gait training;Neuromuscular re-education;Stair training;Functional mobility training;Patient/family education;Therapeutic activities;Therapeutic exercise    PT Goals (Current goals can be found in the Care Plan section)  Acute Rehab PT Goals Patient Stated Goal: to go home PT Goal Formulation: With patient Time For Goal Achievement: 08/27/24 Potential to Achieve Goals: Good    Frequency BID     Co-evaluation               AM-PAC PT 6 Clicks Mobility  Outcome Measure Help needed turning from your back to your side while in a flat bed without using bedrails?: A Little Help needed moving from lying on your back to sitting on the side of a flat bed without using bedrails?: A Little Help needed moving to and from a bed to a chair (including a wheelchair)?: A Little Help needed standing up from a chair using your arms (e.g., wheelchair or bedside chair)?: A Little Help needed to walk in hospital room?: A Little Help needed climbing 3-5 steps with a railing? : A Little 6 Click Score: 18    End of Session   Activity Tolerance: Patient tolerated treatment well Patient left: in bed;with bed alarm set;with call bell/phone within reach Nurse Communication: Mobility status PT Visit Diagnosis: Other abnormalities of gait and mobility (R26.89);Difficulty in walking, not elsewhere classified (R26.2);Muscle weakness (generalized) (M62.81);Pain Pain - Right/Left: Left Pain - part of body: Hip    Time: 1435-1457 PT Time Calculation (min) (ACUTE ONLY): 22 min   Charges:   PT Evaluation $PT Eval Low Complexity: 1 Low PT Treatments $Therapeutic Activity: 8-22 mins PT General Charges $$ ACUTE PT VISIT: 1 Visit         Doyal Shams PT, DPT 4:14 PM,08/13/24

## 2024-08-13 NOTE — Anesthesia Preprocedure Evaluation (Addendum)
"                                    Anesthesia Evaluation  Patient identified by MRN, date of birth, ID band Patient awake    Reviewed: Allergy & Precautions, NPO status , Patient's Chart, lab work & pertinent test results  History of Anesthesia Complications Negative for: history of anesthetic complications  Airway Mallampati: IV   Neck ROM: Full    Dental no notable dental hx.    Pulmonary neg pulmonary ROS   Pulmonary exam normal breath sounds clear to auscultation       Cardiovascular hypertension, Normal cardiovascular exam Rhythm:Regular Rate:Normal  ECG 08/07/24: Sinus rhythm with 1st degree A-V block Otherwise normal ECG   Neuro/Psych  PSYCHIATRIC DISORDERS Anxiety Depression    negative neurological ROS     GI/Hepatic negative GI ROS,,,  Endo/Other  Obesity   Renal/GU negative Renal ROS     Musculoskeletal  (+) Arthritis ,    Abdominal   Peds  Hematology negative hematology ROS (+)   Anesthesia Other Findings   Reproductive/Obstetrics                              Anesthesia Physical Anesthesia Plan  ASA: 2  Anesthesia Plan: Spinal   Post-op Pain Management:    Induction: Intravenous  PONV Risk Score and Plan: 2 and Propofol  infusion, TIVA, Treatment may vary due to age or medical condition and Ondansetron   Airway Management Planned: Natural Airway  Additional Equipment:   Intra-op Plan:   Post-operative Plan:   Informed Consent: I have reviewed the patients History and Physical, chart, labs and discussed the procedure including the risks, benefits and alternatives for the proposed anesthesia with the patient or authorized representative who has indicated his/her understanding and acceptance.       Plan Discussed with: CRNA  Anesthesia Plan Comments: (Plan for spinal and GA with natural airway, LMA/GETA backup.  Patient consented for risks of anesthesia including but not limited to:  - adverse  reactions to medications - damage to eyes, teeth, lips or other oral mucosa - nerve damage due to positioning  - sore throat or hoarseness - headache, bleeding, infection, nerve damage 2/2 spinal - damage to heart, brain, nerves, lungs, other parts of body or loss of life  Informed patient about role of CRNA in peri- and intra-operative care.  Patient voiced understanding.)         Anesthesia Quick Evaluation  "

## 2024-08-13 NOTE — H&P (Signed)
 History of Present Illness: Paul Benton is an 59 y.o. male presents for evaluation of his left hip. The patient reports some chronic achy pain in his left groin which severely worsened over the last 4 months and now limits his ability to stand and walk. 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 He has been treated conservatively with home exercise and anti-inflammatories and reports minimal to no improvement with those interventions. Reports the pain is up to a 10 out of 10 at its worst and is limiting his activities on a daily basis. He is here to discuss other options for his left hip. The patient denies fevers, chills, numbness, tingling, shortness of breath, chest pain, recent illness, or any trauma.  Patient is a non-smoker, nondiabetic with a BMI of 33  Past Medical History: Past Medical History:  Diagnosis Date  Back pain  Chest pain, atypical 05/2017  Has had wrkp w/ Dr. Fernand in cardiology: nml stress test, echo w/ nml EF but severely dilated Lt atrium, nml CCTA.  COVID-19 07/16/2020  Breakthrough infection.  HTN (hypertension) 05/2017  Obesity (BMI 30-39.9), unspecified  Vitamin D deficiency   Past Surgical History: Past Surgical History:  Procedure Laterality Date  KNEE ARTHROSCOPY Left 2007  COLONOSCOPY 07/04/2019  Diverticulosis/Non-bleeding internal hemorrhoids/Otherwise normal. 10 year repeat per [facility].  VASECTOMY  VASECTOMY   Past Family History: Family History  Problem Relation Age of Onset  Hypothyroidism Mother  Ovarian cancer Maternal Grandmother  Myocardial Infarction (Heart attack) Paternal Grandfather  38-50's  Rheum arthritis Father  Osteoarthritis Father  passed away at 66 after knee surgery.  No Known Problems Brother  No Known Problems Daughter  No Known Problems Brother  No Known Problems Brother  No Known Problems Daughter  No Known Problems Daughter   Medications: Current  Outpatient Medications  Medication Sig Dispense Refill  amLODIPine -olmesartan  (AZOR ) 10-20 mg tablet TAKE 1 TABLET BY MOUTH EVERY DAY 90 tablet 1  cholecalciferol (VITAMIN D3) 1,000 unit capsule Take 1,000 Units by mouth once daily.  meloxicam (MOBIC) 15 MG tablet Take 1 tablet (15 mg total) by mouth once daily for 30 days 30 tablet 1   No current facility-administered medications for this visit.   Allergies: No Known Allergies   Visit Vitals: Vitals:  07/22/24 0835  BP: (!) 140/82    Review of Systems:  A comprehensive 14 point ROS was performed, reviewed, and the pertinent orthopaedic findings are documented in the HPI.  Physical Exam: Body mass index is 33.05 kg/m. General/Constitutional: No apparent distress: well-nourished and well developed. Lymphatic: No palpable adenopathy. Pulmonary exam: Lungs clear to auscultation bilaterally no wheezing rales or rhonchi Cardiac exam: Regular rate and rhythm no obvious murmurs rubs or gallops. Vascular: No edema, swelling or tenderness, except as noted in detailed exam. Integumentary: No impressive skin lesions present, except as noted in detailed exam. Neuro/Psych: Normal mood and affect, oriented to person, place and time. Musculoskeletal: Normal, except as noted in detailed exam and in HPI.  Left hip exam  SKIN: intact SWELLING: none WARMTH: no warmth TENDERNESS: none, Stinchfield Positive ROM: 0 degrees internal rotation and 20 degrees external rotation and pain with internal rotation,; Hip Flexion 90 STRENGTH: limited by pain GAIT: antalgic and stiff-legged STABILITY: stable to testing CREPITUS: yes LEG LENGTH DISCREPANCY: none NEUROLOGICAL EXAM: normal VASCULAR EXAM: normal LUMBAR SPINE: tenderness: no straight leg raising sign: no motor exam: normal  The contralateral  hip was examined for comparison and it showed: TENDERNESS: none ROM: 5 degrees of internal rotation 20 degrees of external rotation 100 degrees of  hip flexion pain with flexion internal rotation localized to the groin STRENGTH: normal STABILITY: stable to testing  Hip Imaging :  I reviewed AP pelvis and lateral left hip x-rays performed on 07/06/2024 images reviewed by myself. There is moderate generative changes to severe degenerative changes of the right hip with femoral head deformity flattening osteophyte formation and joint space narrowing with bone-on-bone articulation. The left shoulder shows severe degenerative changes with complete obliteration of the joint space with bone-on-bone articulation, osteophyte formation and cystic changes and flattening of the femoral head. No fractures or dislocations noted.  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 Based upon the patient's continued symptoms and failure to respond to conservative treatment, I have recommended a left total hip replacement for this patient. A long discussion took place with the patient describing what a total joint replacement is and what the procedure would entail. A hip model, similar to the implants that will be used during the operation, was utilized to demonstrate the implants. Choices of implant manufactures were discussed and reviewed. The ability to secure the implant utilizing cement or cementless (press fit) fixation was discussed. Anterior and posterior exposures were discussed. For this patient an appropriate approach will be anterior.   The hospitalization and post-operative care and rehabilitation were also discussed. The use of perioperative antibiotics and DVT prophylaxis were discussed. The risk, benefits and alternatives to a surgical intervention were discussed at length with the patient. The patient was also advised of risks related to the medical comorbidities and elevated body mass index (BMI). A lengthy discussion took place to  review the most common complications including but not limited to: deep vein thrombosis, pulmonary embolus, heart attack, stroke, infection, wound breakdown, heterotopic ossification, dislocation, numbness, leg length in-equality, intraoperative fracture, damage to nerves, tendon,muscles, arteries or other blood vessels, death and other possible complications from anesthesia. The patient was told that we will take steps to minimize these risks by using sterile technique, antibiotics and DVT prophylaxis when appropriate and follow the patient postoperatively in the office setting to monitor progress. The possibility of recurrent pain, no improvement in pain and actual worsening of pain were also discussed with the patient. The risk of dislocation following total hip replacement was discussed and potential precautions to prevent dislocation were reviewed.   Patient asked about and confirms no history of any reactions to metal or metal allergy in the past.  The discharge plan of care focused on the patient going home following surgery. The patient was encouraged to make the necessary arrangements to have someone stay with them when they are discharged home.   The benefits of surgery were discussed with the patient including the potential for improving the patient's current clinical condition through operative intervention. Alternatives to surgical intervention including continued conservative management were also discussed in detail. All questions were answered to the satisfaction of the patient. The patient participated and agreed to the plan of care as well as the use of the recommended implants for their total hip replacement surgery. An information packet was given to the patient to review prior to surgery  The patient received clearance for surgery.. All questions answered the patient agrees above plan made preparations for a left anterior total hip replacement.  Portions  of this record have been created  using Scientist, clinical (histocompatibility and immunogenetics). Dictation errors have been sought, but may not have been identified and corrected.  Arthea Sheer MD

## 2024-08-13 NOTE — Anesthesia Postprocedure Evaluation (Signed)
"   Anesthesia Post Note  Patient: Paul Benton  Procedure(s) Performed: ARTHROPLASTY, HIP, TOTAL, ANTERIOR APPROACH (Left: Hip)  Patient location during evaluation: PACU Anesthesia Type: Spinal Level of consciousness: awake and alert Pain management: pain level controlled Vital Signs Assessment: post-procedure vital signs reviewed and stable Respiratory status: spontaneous breathing and respiratory function stable Cardiovascular status: blood pressure returned to baseline and stable Postop Assessment: spinal receding, no headache, no backache and no apparent nausea or vomiting Anesthetic complications: no   No notable events documented.   Last Vitals:  Vitals:   08/13/24 1018 08/13/24 1030  BP:  108/75  Pulse: 71 60  Resp: 19 16  Temp: 36.8 C   SpO2: 95% 96%    Last Pain:  Vitals:   08/13/24 1018  TempSrc:   PainSc: 0-No pain                 Alfonso Ruths      "

## 2024-08-14 ENCOUNTER — Other Ambulatory Visit: Payer: Self-pay

## 2024-08-14 ENCOUNTER — Encounter: Payer: Self-pay | Admitting: Orthopedic Surgery

## 2024-08-14 DIAGNOSIS — M1612 Unilateral primary osteoarthritis, left hip: Secondary | ICD-10-CM | POA: Diagnosis not present

## 2024-08-14 LAB — CBC
HCT: 38.8 % — ABNORMAL LOW (ref 39.0–52.0)
Hemoglobin: 13.1 g/dL (ref 13.0–17.0)
MCH: 30.9 pg (ref 26.0–34.0)
MCHC: 33.8 g/dL (ref 30.0–36.0)
MCV: 91.5 fL (ref 80.0–100.0)
Platelets: 256 10*3/uL (ref 150–400)
RBC: 4.24 MIL/uL (ref 4.22–5.81)
RDW: 13 % (ref 11.5–15.5)
WBC: 15.2 10*3/uL — ABNORMAL HIGH (ref 4.0–10.5)
nRBC: 0 % (ref 0.0–0.2)

## 2024-08-14 LAB — BASIC METABOLIC PANEL WITH GFR
Anion gap: 10 (ref 5–15)
BUN: 15 mg/dL (ref 6–20)
CO2: 22 mmol/L (ref 22–32)
Calcium: 9 mg/dL (ref 8.9–10.3)
Chloride: 101 mmol/L (ref 98–111)
Creatinine, Ser: 0.86 mg/dL (ref 0.61–1.24)
GFR, Estimated: >60 mL/min
Glucose, Bld: 125 mg/dL — ABNORMAL HIGH (ref 70–99)
Potassium: 4.3 mmol/L (ref 3.5–5.1)
Sodium: 132 mmol/L — ABNORMAL LOW (ref 135–145)

## 2024-08-14 MED ORDER — BISACODYL 5 MG PO TBEC
10.0000 mg | DELAYED_RELEASE_TABLET | Freq: Every day | ORAL | 0 refills | Status: AC | PRN
  Filled 2024-08-14: qty 30, 15d supply, fill #0

## 2024-08-14 MED ORDER — TRAMADOL HCL 50 MG PO TABS
50.0000 mg | ORAL_TABLET | Freq: Four times a day (QID) | ORAL | 0 refills | Status: AC | PRN
  Filled 2024-08-14: qty 28, 7d supply, fill #0

## 2024-08-14 MED ORDER — HYDROCODONE-ACETAMINOPHEN 5-325 MG PO TABS
1.0000 | ORAL_TABLET | ORAL | 0 refills | Status: AC | PRN
  Filled 2024-08-14: qty 30, 3d supply, fill #0

## 2024-08-14 MED ORDER — ONDANSETRON HCL 4 MG PO TABS
4.0000 mg | ORAL_TABLET | Freq: Four times a day (QID) | ORAL | 0 refills | Status: AC | PRN
  Filled 2024-08-14: qty 18, 21d supply, fill #0

## 2024-08-14 MED ORDER — BISACODYL 5 MG PO TBEC: 10.0000 mg | DELAYED_RELEASE_TABLET | Freq: Every day | ORAL | Status: DC | PRN

## 2024-08-14 MED ORDER — ENOXAPARIN SODIUM 40 MG/0.4ML IJ SOSY
40.0000 mg | PREFILLED_SYRINGE | INTRAMUSCULAR | 0 refills | Status: AC
  Filled 2024-08-14: qty 5.6, 14d supply, fill #0

## 2024-08-14 NOTE — Progress Notes (Signed)
 Physical Therapy Treatment Patient Details Name: Paul Benton MRN: [id number] DOB: 04/22/1966 Today's Date: 08/14/2024   History of Present Illness Pt is a 59 yo male s/p L THA. PMH of anxiety, depression, HTN.    PT Comments  Patient A&Ox4, denied pain just stiffness in L hip. Pt was able to perform transfers and ambulation with supervision and RW. Minimal antalgic gait noted, step through reciprocal gait pattern. Able to navigate stairs with CGA, bilateral rails. Pt returned to room, verbally reviewed stair navigation and HEP questions. The patient would benefit from further skilled PT intervention to continue to progress towards goals.      If plan is discharge home, recommend the following: A little help with bathing/dressing/bathroom;A little help with walking and/or transfers;Assistance with cooking/housework;Assist for transportation;Help with stairs or ramp for entrance   Can travel by private vehicle        Equipment Recommendations  Rolling walker (2 wheels)    Recommendations for Other Services       Precautions / Restrictions Precautions Precautions: Fall;Anterior Hip Recall of Precautions/Restrictions: Intact Restrictions Weight Bearing Restrictions Per Provider Order: Yes LLE Weight Bearing Per Provider Order: Weight bearing as tolerated     Mobility  Bed Mobility               General bed mobility comments: OOB pre/post tx    Transfers Overall transfer level: Needs assistance Equipment used: Rolling walker (2 wheels) Transfers: Sit to/from Stand Sit to Stand: Supervision                Ambulation/Gait Ambulation/Gait assistance: Supervision Gait Distance (Feet): 200 Feet Assistive device: Rolling walker (2 wheels)             Stairs Stairs: Yes Stairs assistance: Contact guard assist Stair Management: Two rails, Step to pattern Number of Stairs: 4 General stair comments: steady, safe   Wheelchair Mobility     Tilt Bed     Modified Rankin (Stroke Patients Only)       Balance Overall balance assessment: Needs assistance Sitting-balance support: Feet supported Sitting balance-Leahy Scale: Normal     Standing balance support: Bilateral upper extremity supported Standing balance-Leahy Scale: Fair                              Hotel Manager: No apparent difficulties  Cognition Arousal: Alert Behavior During Therapy: WFL for tasks assessed/performed   PT - Cognitive impairments: No apparent impairments                         Following commands: Intact      Cueing Cueing Techniques: Verbal cues  Exercises      General Comments        Pertinent Vitals/Pain Pain Assessment Pain Assessment:  (reported stiffness)    Home Living Family/patient expects to be discharged to:: Private residence Living Arrangements: Spouse/significant other Available Help at Discharge: Family Type of Home: House Home Access: Level entry       Home Layout: One level Home Equipment: BSC/3in1      Prior Function            PT Goals (current goals can now be found in the care plan section) Progress towards PT goals: Progressing toward goals    Frequency    BID      PT Plan      Co-evaluation  AM-PAC PT 6 Clicks Mobility   Outcome Measure  Help needed turning from your back to your side while in a flat bed without using bedrails?: None Help needed moving from lying on your back to sitting on the side of a flat bed without using bedrails?: None Help needed moving to and from a bed to a chair (including a wheelchair)?: None Help needed standing up from a chair using your arms (e.g., wheelchair or bedside chair)?: None Help needed to walk in hospital room?: A Little Help needed climbing 3-5 steps with a railing? : A Little 6 Click Score: 22    End of Session   Activity Tolerance: Patient tolerated treatment well Patient  left: in chair;with family/visitor present;with call bell/phone within reach Nurse Communication: Mobility status PT Visit Diagnosis: Other abnormalities of gait and mobility (R26.89);Difficulty in walking, not elsewhere classified (R26.2);Muscle weakness (generalized) (M62.81);Pain Pain - Right/Left: Left Pain - part of body: Hip     Time: 0850-0903 PT Time Calculation (min) (ACUTE ONLY): 13 min  Charges:    $Therapeutic Activity: 8-22 mins PT General Charges $$ ACUTE PT VISIT: 1 Visit                     Doyal Shams PT, DPT 11:19 AM,08/14/24

## 2024-08-14 NOTE — Discharge Summary (Signed)
 Physician Discharge Summary  Subjective: 1 Day Post-Op Procedures (LRB): ARTHROPLASTY, HIP, TOTAL, ANTERIOR APPROACH (Left) Patient reports pain as mild.   Patient seen in rounds with Dr. Lorelle. Patient is well, and has had no acute complaints or problems Patient is ready to go home with home health physical therapy  Physician Discharge Summary  Patient ID: Paul Benton MRN: [id number] DOB/AGE: Dec 24, 1965 59 y.o.  Admit date: 08/13/2024 Discharge date: 08/14/2024  Admission Diagnoses:  Discharge Diagnoses:  Principal Problem:   S/P total left hip arthroplasty   Discharged Condition: good  Hospital Course: The patient is postop day 1 from an anterior left total hip replacement.  He is doing very well since surgery.  His vitals are stable.  His pain is manageable.  He has ambulated down the hall twice already.  He is ready to do physical therapy this morning and then wants to go home.  Treatments: surgery:   Left Total Hip Arthroplasty   Components/Implants: Cup: Trident Tritanium clusterhole 54/E w/x2 screws    Liner: Neutral X3 poly 36/E  Stem: Insignia #5 high offset  Head:Biolox ceramic 36 +40mm    Date of Surgery: 08/13/2024   Surgeon: Arthea Lorelle MD   Assistant: Toribio Alas RNFA (present and scrubbed throughout the case, critical for assistance with exposure, retraction, instrumentation, and closure)     Anesthesiologist: Mazzoni   Anesthesia: Spinal    EBL: 200cc   IVF:700cc   Complications: None  Discharge Exam: Blood pressure 139/83, pulse (!) 55, temperature (!) 97.4 F (36.3 C), temperature source Temporal, resp. rate 16, height 5' 11.5 (1.816 m), weight 107.5 kg, SpO2 96%.   Disposition: Discharge disposition: 01-Home or Self Care        Allergies as of 08/14/2024   No Known Allergies      Medication List     TAKE these medications    amlodipine -olmesartan  10-20 MG tablet Commonly known as: AZOR  Take 1 tablet by mouth daily.    bisacodyl  5 MG EC tablet Commonly known as: DULCOLAX Take 2 tablets (10 mg total) by mouth daily as needed for moderate constipation.   CALCIUM CARBONATE ANTACID PO Take 1 tablet by mouth daily as needed for indigestion or heartburn.   enoxaparin  40 MG/0.4ML injection Commonly known as: LOVENOX  Inject 0.4 mLs (40 mg total) into the skin daily.   HYDROcodone -acetaminophen  5-325 MG tablet Commonly known as: NORCO/VICODIN Take 1-2 tablets by mouth every 4 (four) hours as needed for severe pain (pain score 7-10).   meloxicam 15 MG tablet Commonly known as: MOBIC Take 15 mg by mouth daily.   ondansetron  4 MG tablet Commonly known as: ZOFRAN  Take 1 tablet (4 mg total) by mouth every 6 (six) hours as needed for nausea.   traMADol  50 MG tablet Commonly known as: ULTRAM  Take 1 tablet (50 mg total) by mouth every 6 (six) hours as needed for moderate pain (pain score 4-6).   VISINE OP Place 1 drop into both eyes daily as needed (dry eyes).   Vitamin D 125 MCG Caps Take 1 capsule by mouth daily.               Durable Medical Equipment  (From admission, onward)           Start     Ordered   08/13/24 1608  For home use only DME Walker rolling  Once       Question Answer Comment  Walker: With 5 Inch Wheels   Patient needs a walker to  treat with the following condition S/P total hip arthroplasty      08/13/24 1607   08/13/24 1607  For home use only DME Bedside commode  Once       Question:  Patient needs a bedside commode to treat with the following condition  Answer:  S/P total hip arthroplasty   08/13/24 1607            Follow-up Information     Charlene Debby BROCKS, PA-C Follow up in 2 week(s).   Specialties: Orthopedic Surgery, Emergency Medicine Why: Wound care Contact information: [address] [place] [address] [phone]                 Signed: VERLINDA, Margrete Delude 08/14/2024, 7:07 AM   Objective: Vital signs in last 24 hours: Temp:   [97 F (36.1 C)-98.3 F (36.8 C)] 97.4 F (36.3 C) (01/30 0429) Pulse Rate:  [55-71] 55 (01/30 0429) Resp:  [13-19] 16 (01/30 0429) BP: (106-139)/(66-87) 139/83 (01/30 0429) SpO2:  [94 %-99 %] 96 % (01/30 0429)  Intake/Output from previous day:  Intake/Output Summary (Last 24 hours) at 08/14/2024 0707 Last data filed at 08/13/2024 1947 Gross per 24 hour  Intake 885.28 ml  Output 2180 ml  Net -1294.72 ml    Intake/Output this shift: No intake/output data recorded.  Labs: No results for input(s): HGB in the last 72 hours. No results for input(s): WBC, RBC, HCT, PLT in the last 72 hours. No results for input(s): NA, K, CL, CO2, BUN, CREATININE, GLUCOSE, CALCIUM in the last 72 hours. No results for input(s): LABPT, INR in the last 72 hours.  EXAM: General - Patient is Alert and Oriented Extremity - Neurovascular intact Sensation intact distally Dorsiflexion/Plantar flexion intact Compartment soft Incision - clean, dry, no drainage Motor Function -plantarflexion and dorsiflexion are intact.  Assessment/Plan: 1 Day Post-Op Procedures (LRB): ARTHROPLASTY, HIP, TOTAL, ANTERIOR APPROACH (Left) Procedures (LRB): ARTHROPLASTY, HIP, TOTAL, ANTERIOR APPROACH (Left) Past Medical History:  Diagnosis Date   Family history of adverse reaction to anesthesia    Father had knee replacement at the age of 14 and woke up a week later, and was never fully operable.   Hypertension    Vitamin D deficiency    Principal Problem:   S/P total left hip arthroplasty  Estimated body mass index is 32.59 kg/m as calculated from the following:   Height as of this encounter: 5' 11.5 (1.816 m).   Weight as of this encounter: 107.5 kg. Advance diet Up with therapy D/C IV fluids Discharge home with home health Diet - Regular diet Follow up - in 2 weeks Activity - WBAT Disposition - Home Condition Upon Discharge - Good DVT Prophylaxis - Lovenox  and TED  hose.  Krystal Verlinda, PA-C Orthopaedic Surgery 08/14/2024, 7:07 AM

## 2024-08-14 NOTE — Progress Notes (Signed)
" °  Subjective: 1 Day Post-Op Procedures (LRB): ARTHROPLASTY, HIP, TOTAL, ANTERIOR APPROACH (Left) Patient reports pain as mild.   Patient is well, and has had no acute complaints or problems Plan is to go Home after hospital stay. Negative for chest pain and shortness of breath Fever: no Gastrointestinal: Negative for nausea and vomiting  Objective: Vital signs in last 24 hours: Temp:  [97 F (36.1 C)-98.3 F (36.8 C)] 97.4 F (36.3 C) (01/30 0429) Pulse Rate:  [55-71] 55 (01/30 0429) Resp:  [13-19] 16 (01/30 0429) BP: (106-139)/(66-87) 139/83 (01/30 0429) SpO2:  [94 %-99 %] 96 % (01/30 0429)  Intake/Output from previous day:  Intake/Output Summary (Last 24 hours) at 08/14/2024 0702 Last data filed at 08/13/2024 1947 Gross per 24 hour  Intake 885.28 ml  Output 2180 ml  Net -1294.72 ml    Intake/Output this shift: No intake/output data recorded.  Labs: No results for input(s): HGB in the last 72 hours. No results for input(s): WBC, RBC, HCT, PLT in the last 72 hours. No results for input(s): NA, K, CL, CO2, BUN, CREATININE, GLUCOSE, CALCIUM in the last 72 hours. No results for input(s): LABPT, INR in the last 72 hours.   EXAM General - Patient is Alert and Oriented Extremity - Neurovascular intact Sensation intact distally Dorsiflexion/Plantar flexion intact Compartment soft Dressing/Incision - clean, dry, no drainage Motor Function - intact, moving foot and toes well on exam.  Ambulated down the hall twice.  Past Medical History:  Diagnosis Date   Family history of adverse reaction to anesthesia    Father had knee replacement at the age of 57 and woke up a week later, and was never fully operable.   Hypertension    Vitamin D deficiency     Assessment/Plan: 1 Day Post-Op Procedures (LRB): ARTHROPLASTY, HIP, TOTAL, ANTERIOR APPROACH (Left) Principal Problem:   S/P total left hip arthroplasty  Estimated body mass index is 32.59  kg/m as calculated from the following:   Height as of this encounter: 5' 11.5 (1.816 m).   Weight as of this encounter: 107.5 kg. Advance diet Up with therapy D/C IV fluids Discharge home with home health  DVT Prophylaxis - Lovenox  and TED hose Weight-Bearing as tolerated to left leg  Krystal Doyne, PA-C Orthopaedic Surgery 08/14/2024, 7:02 AM  "

## 2024-08-14 NOTE — Evaluation (Signed)
 Occupational Therapy Evaluation Patient Details Name: Paul Benton MRN: [id number] DOB: 12-18-1965 Today's Date: 08/14/2024   History of Present Illness   Pt is a 59 yo male s/p L THA. PMH of anxiety, depression, HTN.     Clinical Impressions Patient was seen for OT evaluation this date. Prior to hospital admission, patient was active and independent. Patient lives with spouse who is able to assist as needed. OT educated patient on anterior hip precautions with good return demo from patient/spouse. OT facilitated ADL, patient requires min A with dressing to don L sock, discussed use of sock aid but patient declines needing. Patient performed in room transfers/mobility with CGA/SBA and denied pain.  Do not anticipate the need for follow up OT services upon acute hospital DC.      If plan is discharge home, recommend the following:   A little help with walking and/or transfers;A little help with bathing/dressing/bathroom     Functional Status Assessment   Patient has had a recent decline in their functional status and demonstrates the ability to make significant improvements in function in a reasonable and predictable amount of time.     Equipment Recommendations   None recommended by OT     Recommendations for Other Services         Precautions/Restrictions   Precautions Precautions: Fall;Anterior Hip Precaution Booklet Issued: Yes (comment) Recall of Precautions/Restrictions: Intact Restrictions Weight Bearing Restrictions Per Provider Order: Yes LLE Weight Bearing Per Provider Order: Weight bearing as tolerated     Mobility Bed Mobility               General bed mobility comments: OOB pre/post tx    Transfers Overall transfer level: Needs assistance Equipment used: Rolling walker (2 wheels) Transfers: Sit to/from Stand Sit to Stand: Supervision                  Balance Overall balance assessment: Needs assistance Sitting-balance support:  Feet supported Sitting balance-Leahy Scale: Normal     Standing balance support: Bilateral upper extremity supported Standing balance-Leahy Scale: Fair                             ADL either performed or assessed with clinical judgement   ADL Overall ADL's : Needs assistance/impaired                                       General ADL Comments: requires A to don sock to LLE, able to perform all other parts of ADLs with set up A     Vision         Perception         Praxis         Pertinent Vitals/Pain Pain Assessment Pain Assessment: 0-10 Pain Score: 2      Extremity/Trunk Assessment Upper Extremity Assessment Upper Extremity Assessment: Overall WFL for tasks assessed   Lower Extremity Assessment Lower Extremity Assessment: Defer to PT evaluation       Communication Communication Communication: No apparent difficulties   Cognition Arousal: Alert Behavior During Therapy: [facility] for tasks assessed/performed                                 Following commands: Intact       Cueing  General Comments  Cueing Techniques: Verbal cues      Exercises     Shoulder Instructions      Home Living Family/patient expects to be discharged to:: Private residence Living Arrangements: Spouse/significant other Available Help at Discharge: Family Type of Home: House Home Access: Level entry     Home Layout: One level     Bathroom Shower/Tub: Producer, Television/film/video: Standard Bathroom Accessibility: Yes How Accessible: Accessible via walker Home Equipment: BSC/3in1          Prior Functioning/Environment Prior Level of Function : Independent/Modified Independent;Driving             Mobility Comments: no AD ADLs Comments: independent    OT Problem List: Decreased activity tolerance   OT Treatment/Interventions:        OT Goals(Current goals can be found in the care plan section)   Acute  Rehab OT Goals Patient Stated Goal: to go home OT Goal Formulation: With patient Time For Goal Achievement: 08/28/24 Potential to Achieve Goals: Good ADL Goals Pt Will Perform Grooming: with modified independence;standing Pt Will Perform Lower Body Dressing: with modified independence;sit to/from stand Pt Will Transfer to Toilet: with modified independence;regular height toilet   OT Frequency:       Co-evaluation              AM-PAC OT 6 Clicks Daily Activity     Outcome Measure Help from another person eating meals?: None Help from another person taking care of personal grooming?: None Help from another person toileting, which includes using toliet, bedpan, or urinal?: None Help from another person bathing (including washing, rinsing, drying)?: None Help from another person to put on and taking off regular upper body clothing?: None Help from another person to put on and taking off regular lower body clothing?: A Little 6 Click Score: 23   End of Session Equipment Utilized During Treatment: Rolling walker (2 wheels) Nurse Communication: Mobility status  Activity Tolerance: Patient tolerated treatment well Patient left: in chair;with family/visitor present  OT Visit Diagnosis: Unsteadiness on feet (R26.81)                Time: 9191-9168 OT Time Calculation (min): 23 min Charges:  OT General Charges $OT Visit: 1 Visit OT Evaluation $OT Eval Low Complexity: 1 Low OT Treatments $Self Care/Home Management : 8-22 mins  Rogers Clause, OT/L [facility], 08/14/2024

## 2024-08-14 NOTE — TOC Transition Note (Signed)
 Transition of Care [facility]) - Discharge Note   Patient Details  Name: Paul Benton MRN: [id number] Date of Birth: 03-02-1966  [facility] CM/SW Contact:  Victory Jackquline RAMAN, RN Phone Number: 08/14/2024, 11:03 AM   Clinical Narrative:    RNCM met with patient and significant other at bedside. RNCM introduced myself and my role and explained that discharge planning would be discussed. Patient discharging home with [facility] for PT/OT that was arranged by Dr. Mal office. RW delivered to bedside. Patient states he already has a BSC.  Patient's significant other will be taking him home at the time of discharge. Patient states that he will be staying at his significant others house for the next few weeks.  Pt has discharge orders, no further concerns. RNCM signing off.   Final next level of care: Home w Home Health Services Barriers to Discharge: Barriers Resolved   Patient Goals and CMS Choice            Discharge Placement                Patient to be transferred to facility by: Significant Other Name of family member notified: Amiee Patient and family notified of of transfer: 08/14/24  Discharge Plan and Services Additional resources added to the After Visit Summary for                  DME Arranged: Walker rolling DME Agency: [employer] Date DME Agency Contacted: 08/13/24   Representative spoke with at DME Agency: [place]         Representative spoke with at [facility] Agency: HH/PT was set up by patient's MD's office prior to surgery with [facility].  Social Drivers of Health (SDOH) Interventions SDOH Screenings   Food Insecurity: No Food Insecurity (08/13/2024)  Housing: Low Risk (08/13/2024)  Transportation Needs: No Transportation Needs (08/13/2024)  Utilities: Not At Risk (08/13/2024)  Financial Resource Strain: Low Risk  (12/20/2023)   Received from [facility]  Tobacco Use: Low Risk (08/07/2024)     Readmission Risk Interventions      No data to display

## 2024-08-14 NOTE — Plan of Care (Signed)
" °  Problem: Respiratory: Goal: Respiratory status will improve Outcome: Progressing   Problem: Activity: Goal: Ability to avoid complications of mobility impairment will improve Outcome: Progressing Goal: Ability to tolerate increased activity will improve Outcome: Progressing   Problem: Pain Management: Goal: Pain level will decrease with appropriate interventions Outcome: Progressing   "
